# Patient Record
Sex: Female | Born: 1990 | Race: Black or African American | Hispanic: No | Marital: Single | Smoking: Current every day smoker
Health system: Southern US, Community
[De-identification: ages and names within clinical notes are randomized; demographics above are authoritative.]

## PROBLEM LIST (undated history)

## (undated) DIAGNOSIS — I10 Essential (primary) hypertension: Secondary | ICD-10-CM

---

## 2015-04-08 ENCOUNTER — Encounter (HOSPITAL_BASED_OUTPATIENT_CLINIC_OR_DEPARTMENT_OTHER): Payer: Self-pay | Admitting: *Deleted

## 2015-04-08 ENCOUNTER — Emergency Department (HOSPITAL_BASED_OUTPATIENT_CLINIC_OR_DEPARTMENT_OTHER)
Admission: EM | Admit: 2015-04-08 | Discharge: 2015-04-08 | Disposition: A | Discharge status: Home / Self Care | Payer: Self-pay | Attending: Emergency Medicine | Admitting: Emergency Medicine

## 2015-04-08 DIAGNOSIS — K0889 Other specified disorders of teeth and supporting structures: Secondary | ICD-10-CM

## 2015-04-08 DIAGNOSIS — Z3A13 13 weeks gestation of pregnancy: Secondary | ICD-10-CM | POA: Insufficient documentation

## 2015-04-08 DIAGNOSIS — F17210 Nicotine dependence, cigarettes, uncomplicated: Secondary | ICD-10-CM | POA: Insufficient documentation

## 2015-04-08 DIAGNOSIS — Z3490 Encounter for supervision of normal pregnancy, unspecified, unspecified trimester: Secondary | ICD-10-CM

## 2015-04-08 DIAGNOSIS — K029 Dental caries, unspecified: Secondary | ICD-10-CM | POA: Insufficient documentation

## 2015-04-08 DIAGNOSIS — O99611 Diseases of the digestive system complicating pregnancy, first trimester: Secondary | ICD-10-CM | POA: Insufficient documentation

## 2015-04-08 DIAGNOSIS — O99331 Smoking (tobacco) complicating pregnancy, first trimester: Secondary | ICD-10-CM | POA: Insufficient documentation

## 2015-04-08 DIAGNOSIS — K088 Other specified disorders of teeth and supporting structures: Secondary | ICD-10-CM | POA: Insufficient documentation

## 2015-04-08 LAB — PREGNANCY, URINE: Preg Test, Ur: POSITIVE — AB

## 2015-04-08 MED ORDER — HYDROCODONE-ACETAMINOPHEN 5-325 MG PO TABS: 1.0000 | ORAL_TABLET | Freq: Four times a day (QID) | ORAL | Status: DC | PRN

## 2015-04-08 MED ORDER — AMOXICILLIN 500 MG PO CAPS: 500.0000 mg | ORAL_CAPSULE | Freq: Three times a day (TID) | ORAL | Status: DC

## 2015-04-08 NOTE — Discharge Instructions (Signed)
Return here as needed.  Follow-up with the GYN clinic provided.  Follow-up with the dentist as well

## 2015-04-08 NOTE — ED Provider Notes (Signed)
CSN: [id number]     Arrival date & time 04/08/15  1644 History   First MD Initiated Contact with Patient 04/08/15 1714     Chief Complaint  Patient presents with  . Dental Pain     (Consider location/radiation/quality/duration/timing/severity/associated sxs/prior Treatment) HPI Patient presents to the emergency department with lower dental pain for the last 3 days.  Patient states that she has multiple bad teeth and she is unsure which ones causing the most pain.  Patient states that she has had no period since June.  Patient denies fever, nausea, vomiting, trouble swallowing, neck swelling, weakness, dizziness, headache, blurred vision, or syncope.  The patient states that she did not take any medications prior to arrival for her symptoms.  Patient also denies any abdominal pain History reviewed. No pertinent past medical history. Past Surgical History  Procedure Laterality Date  . Cesarean section     No family history on file. Social History  Substance Use Topics  . Smoking status: Current Every Day Smoker -- 0.50 packs/day    Types: Cigarettes  . Smokeless tobacco: None  . Alcohol Use: No   OB History    No data available     Review of Systems All other systems negative except as documented in the HPI. All pertinent positives and negatives as reviewed in the HPI.  Allergies  Review of patient's allergies indicates no known allergies.  Home Medications   Prior to Admission medications   Not on File   BP 129/67 mmHg  Pulse 82  Temp(Src) 98.7 F (37.1 C) (Oral)  Resp 20  Ht  (1.499 m)  Wt 172 lb (78.019 kg)  BMI 34.72 kg/m2  SpO2 100%  LMP 01/22/2015 Physical Exam  Constitutional: She is oriented to person, place, and time. She appears well-developed and well-nourished. No distress.  HENT:  Head: Normocephalic and atraumatic.  Mouth/Throat: Uvula is midline. No trismus in the jaw. No uvula swelling.    Eyes: Pupils are equal, round, and reactive to  light.  Cardiovascular: Normal rate, regular rhythm and normal heart sounds.   Abdominal: Soft. Bowel sounds are normal. There is no tenderness.  Neurological: She is alert and oriented to person, place, and time. She exhibits normal muscle tone. Coordination normal.  Skin: Skin is warm and dry.  Nursing note and vitals reviewed.   ED Course  Procedures (including critical care time) Labs Review Labs Reviewed  PREGNANCY, URINE - Abnormal; Notable for the following:    Preg Test, Ur POSITIVE (*)    All other components within normal limits    I have personally reviewed and evaluated these images and lab results as part of my medical decision-making.    Patient is advised that she is pregnant.  We will treat the patient for her dental issues a is advised to follow-up with the GYN doctor  Charlestine Night, PA-C 04/08/15 1749  Jerelyn Scott, MD 04/08/15 1754

## 2015-04-08 NOTE — ED Notes (Addendum)
Dental pain x 3 days. (she is not here for a pregnancy test, but she has not had a menses since June so triage nurse ordered a urine pregnancy test).

## 2016-11-30 ENCOUNTER — Encounter (HOSPITAL_BASED_OUTPATIENT_CLINIC_OR_DEPARTMENT_OTHER): Payer: Self-pay | Admitting: *Deleted

## 2016-11-30 ENCOUNTER — Emergency Department (HOSPITAL_BASED_OUTPATIENT_CLINIC_OR_DEPARTMENT_OTHER)
Admission: EM | Admit: 2016-11-30 | Discharge: 2016-11-30 | Disposition: A | Discharge status: Home / Self Care | Payer: Medicaid Other | Attending: Emergency Medicine | Admitting: Emergency Medicine

## 2016-11-30 DIAGNOSIS — K047 Periapical abscess without sinus: Secondary | ICD-10-CM | POA: Diagnosis not present

## 2016-11-30 DIAGNOSIS — F17210 Nicotine dependence, cigarettes, uncomplicated: Secondary | ICD-10-CM | POA: Diagnosis not present

## 2016-11-30 DIAGNOSIS — K0889 Other specified disorders of teeth and supporting structures: Secondary | ICD-10-CM | POA: Diagnosis present

## 2016-11-30 MED ORDER — LIDOCAINE VISCOUS 2 % MT SOLN: 15.0000 mL | OROMUCOSAL | 2 refills | Status: AC | PRN

## 2016-11-30 MED ORDER — HYDROCODONE-ACETAMINOPHEN 5-325 MG PO TABS: 1.0000 | ORAL_TABLET | Freq: Four times a day (QID) | ORAL | 0 refills | Status: AC | PRN

## 2016-11-30 MED ORDER — KETOROLAC TROMETHAMINE 60 MG/2ML IM SOLN
60.0000 mg | Freq: Once | INTRAMUSCULAR | Status: AC
  Administered 2016-11-30: 60 mg via INTRAMUSCULAR
  Filled 2016-11-30: qty 2

## 2016-11-30 MED ORDER — BUPIVACAINE-EPINEPHRINE (PF) 0.5% -1:200000 IJ SOLN
1.8000 mL | Freq: Once | INTRAMUSCULAR | Status: AC
  Administered 2016-11-30: 1.8 mL
  Filled 2016-11-30: qty 1.8

## 2016-11-30 MED ORDER — IBUPROFEN 600 MG PO TABS: 600.0000 mg | ORAL_TABLET | Freq: Four times a day (QID) | ORAL | 0 refills | Status: AC | PRN

## 2016-11-30 MED ORDER — PENICILLIN V POTASSIUM 500 MG PO TABS: 500.0000 mg | ORAL_TABLET | Freq: Four times a day (QID) | ORAL | 0 refills | Status: AC

## 2016-11-30 MED ORDER — PENICILLIN V POTASSIUM 250 MG PO TABS
500.0000 mg | ORAL_TABLET | Freq: Once | ORAL | Status: AC
  Administered 2016-11-30: 500 mg via ORAL
  Filled 2016-11-30: qty 2

## 2016-11-30 NOTE — ED Triage Notes (Signed)
Woke with swelling to the right side of her face.

## 2016-11-30 NOTE — Discharge Instructions (Signed)
You have been seen today for dental pain. You should follow up with a dentist as soon as possible. This problem will not resolve on its own without the care of a dentist. Use ibuprofen or naproxen for pain. Use the viscous lidocaine for mouth pain. Swish with the lidocaine and spit it out. Do not swallow it. Return to the ED immediately should you begin to have a fever (temperature over 100.19F), continued vomiting, difficulty swallowing or talking, drooling, or any worsening symptoms.  Please take all of your antibiotics until finished!   You may develop abdominal discomfort or diarrhea from the antibiotic.  You may help offset this with probiotics which you can buy or get in yogurt. Do not eat or take the probiotics until 2 hours after your antibiotic.   Dental Resource Guide  [facility] [address], [address] [place], [place] [address] [phone]  [facility] [place] [address] [place], [place] [address] [phone]  [facility] [address] [place], [place] [address] [phone]  [facility] [address] [place], [place] [address] [phone]  [facility] [address] [place], [place] [phone]) [phone]  [facility] [employer] [email or web address]/[email or web address]/becomepatient  [employer] [address] [place], [place] [address] [phone]  Website for free, low-income, or sliding scale dental services in [place]: www.freedental.us  To find a dentist in [place] and surrounding areas: [employer].[email or web address]/[email or web address]-the-public/find-[email or web address]-dentist  [facility] [employer][email or web address]://[email or web address]/meetings-events/[email or web address]-missions-[email or web address]-mercy  [facility] Medicaid Dentist http://[email or web address]/https://dma.[email or web address]/find-[email or web address]-doctor/medicaid-dental-providers

## 2016-11-30 NOTE — ED Provider Notes (Signed)
[facility] Provider Note   CSN: [id number] Arrival date & time: 11/30/16  1545  By signing my name below, I, Doreatha Martin, attest that this documentation has been prepared under the direction and in the presence of Shawn Joy, PA-C. Electronically Signed: Doreatha Martin, ED Scribe. 11/30/16. 4:21 PM.    History   Chief Complaint Chief Complaint  Patient presents with  . Dental Pain    HPI Carrie Mcfarland is a 26 y.o. female who presents to the Emergency Department complaining of moderate, gradually worsening right lower dental pain with radiation to the right forehead and right ear for a few days. Per pt, her symptoms started with a right-sided HA a week ago before her dental pain and gradual onset right-sided facial swelling began, which significantly worsened this morning. She also notes post-nasal drip and one episode of vomiting today after drinking water. Per pt, her post nasal drip is clear. Pt states her pain is worsened by eating and drinking. She has h/o dental extractions, but no h/o similar dental infections. She is not currently followed by dentistry. She denies fever/chills, other nausea/vomiting, chest pain, neck pain/stiffness, left ear pain, visual changes, ear drainage, difficulty swallowing or tolerating secretions, difficulty breathing, or any other complaints.    The history is provided by the patient. No language interpreter was used.    History reviewed. No pertinent past medical history.  There are no active problems to display for this patient.   Past Surgical History:  Procedure Laterality Date  . CESAREAN SECTION      OB History    No data available       Home Medications    Prior to Admission medications   Medication Sig Start Date End Date Taking? Authorizing Provider  HYDROcodone-acetaminophen (NORCO/VICODIN) 5-325 MG tablet Take 1 tablet by mouth every 6 (six) hours as needed. 11/30/16   Shawn C Joy, PA-C  ibuprofen (ADVIL,MOTRIN) 600 MG  tablet Take 1 tablet (600 mg total) by mouth every 6 (six) hours as needed. 11/30/16   Shawn C Joy, PA-C  lidocaine (XYLOCAINE) 2 % solution Use as directed 15 mLs in the mouth or throat as needed for mouth pain. 11/30/16   Shawn C Joy, PA-C  penicillin v potassium (VEETID) 500 MG tablet Take 1 tablet (500 mg total) by mouth 4 (four) times daily. 11/30/16 12/07/16  Anselm Pancoast, PA-C    Family History No family history on file.  Social History Social History  Substance Use Topics  . Smoking status: Current Every Day Smoker    Packs/day: 0.50    Types: Cigarettes  . Smokeless tobacco: Never Used  . Alcohol use No     Allergies   Patient has no known allergies.   Review of Systems Review of Systems  Constitutional: Negative for chills and fever.  HENT: Positive for dental problem, ear pain (right), facial swelling and postnasal drip. Negative for ear discharge and trouble swallowing.   Eyes: Negative for visual disturbance.  Respiratory: Negative for shortness of breath.   Cardiovascular: Negative for chest pain.  Gastrointestinal: Positive for vomiting (one episode). Negative for nausea.  Neurological: Positive for headaches. Negative for dizziness and light-headedness.  All other systems reviewed and are negative.    Physical Exam Updated Vital Signs BP (!) 152/88 (BP Location: Left Arm)   Pulse 76   Temp 99.2 F (37.3 C) (Oral)   Resp 18   Ht 4\' 11"  (1.499 m)   Wt 172 lb (78 kg)  LMP 11/16/2016   SpO2 100%   BMI 34.74 kg/m   Physical Exam  Constitutional: She appears well-developed and well-nourished. No distress.  HENT:  Head: Normocephalic and atraumatic.  Swelling to the right posterior mandibular gingival surface near the rear-most molar. The swelling appears to extend into the right buccal surface. There is no noted defined area of fluctuance. No noted sublingual swelling.  Tenderness and swelling to the right side of the face extending posteriorly to the angle  of the jaw. No extension into the submandibular space. No noted submental swelling. No noted left-sided swelling or tenderness. Mouth opening to about two finger widths. She has no trismus. She is handling all secretions without difficulty.   Eyes: Conjunctivae are normal.  Neck: Normal range of motion. Neck supple.  Cardiovascular: Normal rate, regular rhythm, normal heart sounds and intact distal pulses.   Pulmonary/Chest: Effort normal and breath sounds normal. No stridor. No respiratory distress.  Abdominal: She exhibits no distension. There is no guarding.  Musculoskeletal: Normal range of motion. She exhibits no edema.  Lymphadenopathy:    She has cervical adenopathy (bilateral).  Neurological: She is alert.  Skin: Skin is warm and dry. She is not diaphoretic.  Psychiatric: She has a normal mood and affect. Her behavior is normal.  Nursing note and vitals reviewed.    ED Treatments / Results   DIAGNOSTIC STUDIES: Oxygen Saturation is 100% on RA, normal by my interpretation.    COORDINATION OF CARE: 4:10 PM Discussed treatment plan with pt at bedside which includes dental follow up, antibiotics, antiinflammatories and pt agreed to plan. Pt offered dental block in the ED and wishes to proceed with the procedure.   Procedures Dental Block Date/Time: 11/30/2016 4:22 PM Performed by: Anselm PancoastJOY, SHAWN C Authorized by: Anselm PancoastJOY, SHAWN C   Consent:    Consent obtained:  Verbal   Consent given by:  Patient   Risks discussed:  Allergic reaction, infection, pain and unsuccessful block   Alternatives discussed:  No treatment Indications:    Indications: dental pain   Location:    Block type:  Inferior alveolar   Laterality:  Right Procedure details (see MAR for exact dosages):    Needle gauge:  27 G   Anesthetic injected:  Bupivacaine 0.5% WITH epi   Injection procedure:  Anatomic landmarks identified, anatomic landmarks palpated, introduced needle, negative aspiration for blood and  incremental injection Post-procedure details:    Outcome:  Anesthesia achieved   Patient tolerance of procedure:  Tolerated well, no immediate complications    (including critical care time)  Medications Ordered in ED Medications  ketorolac (TORADOL) injection 60 mg (60 mg Intramuscular Given 11/30/16 1627)  bupivacaine-epinephrine (MARCAINE W/ EPI) 0.5% -1:200000 injection 1.8 mL (1.8 mLs Infiltration Given 11/30/16 1628)  penicillin v potassium (VEETID) tablet 500 mg (500 mg Oral Given 11/30/16 1627)     Initial Impression / Assessment and Plan / ED Course  I have reviewed the triage vital signs and the nursing notes.        Patient presents with dental pain and facial swelling. Patient is nontoxic appearing, afebrile, not tachycardic, not tachypneic, not hypotensive, maintains SPO2 of 100% on room air, and is in no apparent distress. Patient has no signs of sepsis or other serious or life-threatening condition. I do not suspect Ludwig's angioedema in this patient. The patient was given instructions for home care, including a liquid diet, as well as strict return precautions. Patient voices understanding of these instructions, accepts the plan, and  is comfortable with discharge.    Vitals:   11/30/16 1547 11/30/16 1548 11/30/16 1702  BP: (!) 152/88  134/73  Pulse: 76  65  Resp: 18  18  Temp: 99.2 F (37.3 C)    TempSrc: Oral    SpO2: 100%  100%  Weight:  78 kg   Height:  4\' 11"  (1.499 m)       Final Clinical Impressions(s) / ED Diagnoses   Final diagnoses:  Dental infection    New Prescriptions Discharge Medication List as of 11/30/2016  4:58 PM    START taking these medications   Details  ibuprofen (ADVIL,MOTRIN) 600 MG tablet Take 1 tablet (600 mg total) by mouth every 6 (six) hours as needed., Starting Fri 11/30/2016, Print    lidocaine (XYLOCAINE) 2 % solution Use as directed 15 mLs in the mouth or throat as needed for mouth pain., Starting Fri 11/30/2016, Print      penicillin v potassium (VEETID) 500 MG tablet Take 1 tablet (500 mg total) by mouth 4 (four) times daily., Starting Fri 11/30/2016, Until Fri 12/07/2016, Print        I personally performed the services described in this documentation, which was scribed in my presence. The recorded information has been reviewed and is accurate.   Anselm Pancoast, PA-C 12/01/16 0121    Benjiman Core, MD 12/01/16 2308

## 2016-11-30 NOTE — ED Notes (Signed)
PA in for dental block, tol well

## 2016-11-30 NOTE — ED Notes (Signed)
Pt. Reports pain started as a headache a week ago and now the R side of face is swollen. Pt. Has had lower teeth extractions in past and now has molar noted with decay and poor dental hygiene.  Pt. Has swelling to the gingiva in the lower mandible area where the wisdom tooth would be.

## 2016-11-30 NOTE — ED Notes (Signed)
ED Provider at bedside. 

## 2017-06-07 ENCOUNTER — Other Ambulatory Visit: Payer: Self-pay

## 2017-06-07 ENCOUNTER — Encounter (HOSPITAL_BASED_OUTPATIENT_CLINIC_OR_DEPARTMENT_OTHER): Payer: Self-pay | Admitting: *Deleted

## 2017-06-07 ENCOUNTER — Emergency Department (HOSPITAL_BASED_OUTPATIENT_CLINIC_OR_DEPARTMENT_OTHER): Payer: Self-pay

## 2017-06-07 ENCOUNTER — Emergency Department (HOSPITAL_BASED_OUTPATIENT_CLINIC_OR_DEPARTMENT_OTHER)
Admission: EM | Admit: 2017-06-07 | Discharge: 2017-06-07 | Disposition: A | Discharge status: Home / Self Care | Payer: Self-pay | Attending: Emergency Medicine | Admitting: Emergency Medicine

## 2017-06-07 DIAGNOSIS — O99331 Smoking (tobacco) complicating pregnancy, first trimester: Secondary | ICD-10-CM | POA: Insufficient documentation

## 2017-06-07 DIAGNOSIS — Z79899 Other long term (current) drug therapy: Secondary | ICD-10-CM | POA: Insufficient documentation

## 2017-06-07 DIAGNOSIS — Z3A01 Less than 8 weeks gestation of pregnancy: Secondary | ICD-10-CM | POA: Insufficient documentation

## 2017-06-07 DIAGNOSIS — O039 Complete or unspecified spontaneous abortion without complication: Secondary | ICD-10-CM | POA: Insufficient documentation

## 2017-06-07 DIAGNOSIS — F17210 Nicotine dependence, cigarettes, uncomplicated: Secondary | ICD-10-CM | POA: Insufficient documentation

## 2017-06-07 LAB — CBC WITH DIFFERENTIAL/PLATELET
BASOS PCT: 0 %
Basophils Absolute: 0 10*3/uL (ref 0.0–0.1)
EOS ABS: 0 10*3/uL (ref 0.0–0.7)
Eosinophils Relative: 0 %
HEMATOCRIT: 36.2 % (ref 36.0–46.0)
HEMOGLOBIN: 12.6 g/dL (ref 12.0–15.0)
LYMPHS ABS: 1.4 10*3/uL (ref 0.7–4.0)
Lymphocytes Relative: 18 %
MCH: 32.4 pg (ref 26.0–34.0)
MCHC: 34.8 g/dL (ref 30.0–36.0)
MCV: 93.1 fL (ref 78.0–100.0)
Monocytes Absolute: 0.6 10*3/uL (ref 0.1–1.0)
Monocytes Relative: 7 %
NEUTROS ABS: 5.7 10*3/uL (ref 1.7–7.7)
NEUTROS PCT: 75 %
Platelets: 203 10*3/uL (ref 150–400)
RBC: 3.89 MIL/uL (ref 3.87–5.11)
RDW: 13.1 % (ref 11.5–15.5)
WBC: 7.6 10*3/uL (ref 4.0–10.5)

## 2017-06-07 LAB — COMPREHENSIVE METABOLIC PANEL
ALBUMIN: 4.4 g/dL (ref 3.5–5.0)
ALT: 11 U/L — ABNORMAL LOW (ref 14–54)
ANION GAP: 8 (ref 5–15)
AST: 19 U/L (ref 15–41)
Alkaline Phosphatase: 51 U/L (ref 38–126)
BILIRUBIN TOTAL: 0.5 mg/dL (ref 0.3–1.2)
BUN: 13 mg/dL (ref 6–20)
CO2: 26 mmol/L (ref 22–32)
Calcium: 9.2 mg/dL (ref 8.9–10.3)
Chloride: 104 mmol/L (ref 101–111)
Creatinine, Ser: 0.73 mg/dL (ref 0.44–1.00)
GFR calc Af Amer: >60 mL/min (ref >60)
Glucose, Bld: 99 mg/dL (ref 65–99)
POTASSIUM: 3.3 mmol/L — AB (ref 3.5–5.1)
Sodium: 138 mmol/L (ref 135–145)
TOTAL PROTEIN: 8 g/dL (ref 6.5–8.1)

## 2017-06-07 LAB — URINALYSIS, MICROSCOPIC (REFLEX)

## 2017-06-07 LAB — URINALYSIS, ROUTINE W REFLEX MICROSCOPIC
Glucose, UA: NEGATIVE mg/dL
Ketones, ur: 15 mg/dL — AB
NITRITE: NEGATIVE
Protein, ur: 30 mg/dL — AB
Specific Gravity, Urine: 1.025 (ref 1.005–1.030)
pH: 6.5 (ref 5.0–8.0)

## 2017-06-07 LAB — ABO/RH: ABO/RH(D): B POS

## 2017-06-07 LAB — HCG, QUANTITATIVE, PREGNANCY: HCG, BETA CHAIN, QUANT, S: 86 m[IU]/mL — AB (ref <5)

## 2017-06-07 LAB — LIPASE, BLOOD: LIPASE: 19 U/L (ref 11–51)

## 2017-06-07 MED ORDER — POTASSIUM CHLORIDE CRYS ER 20 MEQ PO TBCR: 20.0000 meq | EXTENDED_RELEASE_TABLET | Freq: Every day | ORAL | 0 refills | Status: AC

## 2017-06-07 MED ORDER — SODIUM CHLORIDE 0.9 % IV BOLUS (SEPSIS)
1000.0000 mL | Freq: Once | INTRAVENOUS | Status: AC
  Administered 2017-06-07: 1000 mL via INTRAVENOUS

## 2017-06-07 MED ORDER — ONDANSETRON 4 MG PO TBDP
4.0000 mg | ORAL_TABLET | Freq: Three times a day (TID) | ORAL | 0 refills | Status: AC | PRN
Start: 2017-06-07 — End: ?

## 2017-06-07 MED ORDER — ACETAMINOPHEN 325 MG PO TABS
650.0000 mg | ORAL_TABLET | Freq: Once | ORAL | Status: AC
  Administered 2017-06-07: 650 mg via ORAL
  Filled 2017-06-07: qty 2

## 2017-06-07 MED ORDER — ONDANSETRON HCL 4 MG/2ML IJ SOLN
4.0000 mg | Freq: Once | INTRAMUSCULAR | Status: AC
Start: 2017-06-07 — End: 2017-06-07
  Administered 2017-06-07: 4 mg via INTRAVENOUS
  Filled 2017-06-07: qty 2

## 2017-06-07 MED ORDER — POTASSIUM CHLORIDE CRYS ER 20 MEQ PO TBCR
40.0000 meq | EXTENDED_RELEASE_TABLET | Freq: Once | ORAL | Status: AC
  Administered 2017-06-07: 40 meq via ORAL
  Filled 2017-06-07: qty 2

## 2017-06-07 MED ORDER — ONDANSETRON HCL 4 MG/2ML IJ SOLN
4.0000 mg | Freq: Once | INTRAMUSCULAR | Status: AC
  Administered 2017-06-07: 4 mg via INTRAVENOUS
  Filled 2017-06-07: qty 2

## 2017-06-07 NOTE — ED Notes (Signed)
Patient transported to Ultrasound 

## 2017-06-07 NOTE — ED Provider Notes (Signed)
[facility] EMERGENCY DEPARTMENT Provider Note   CSN: [id number] Arrival date & time: 06/07/17  0850     History   Chief Complaint Chief Complaint  Patient presents with  . Vaginal Bleeding    possible miscarriage    HPI Carrie Mcfarland is a 26 y.o. female G4 P3 003 approximately 7 weeks 6 days pregnant based on LMP with no significant past medical history presenting with persistent abdominal pain and vaginal bleeding for the past week. Patient is reporting heavy bleeding and passing clots. She has been vomiting for the last 2 days and not eating in the past 4 days. She states that she was seen at [facility] on Monday and ultrasound performed did not visualize intrauterine pregnancy. She was told to follow-up with her OB/GYN but she reports that she was unable to do so. She returned to the emergency department but left without being seen twice in the last week. She is here today with continued bleeding and abdominal pain generalized but worse in the lower pelvic region all the way across her abdomen. Denies fever, chills, diarrhea or other symptoms.  HPI  History reviewed. No pertinent past medical history.  There are no active problems to display for this patient.   Past Surgical History:  Procedure Laterality Date  . CESAREAN SECTION      OB History    Gravida Para Term Preterm AB Living   1             SAB TAB Ectopic Multiple Live Births                   Home Medications    Prior to Admission medications   Medication Sig Start Date End Date Taking? Authorizing Provider  HYDROcodone-acetaminophen (NORCO/VICODIN) 5-325 MG tablet Take 1 tablet by mouth every 6 (six) hours as needed. 11/30/16   Joy, Shawn C, PA-C  ibuprofen (ADVIL,MOTRIN) 600 MG tablet Take 1 tablet (600 mg total) by mouth every 6 (six) hours as needed. 11/30/16   Joy, Shawn C, PA-C  lidocaine (XYLOCAINE) 2 % solution Use as directed 15 mLs in the mouth or throat as needed for mouth pain.  11/30/16   Joy, Shawn C, PA-C  ondansetron (ZOFRAN ODT) 4 MG disintegrating tablet Take 1 tablet (4 mg total) every 8 (eight) hours as needed by mouth for nausea or vomiting. 06/07/17   Mathews RobinsonsMitchell, Zebastian Carico B, PA-C  potassium chloride SA (K-DUR,KLOR-CON) 20 MEQ tablet Take 1 tablet (20 mEq total) daily for 4 doses by mouth. 06/07/17 06/11/17  Georgiana ShoreMitchell, Iviona Hole B, PA-C    Family History No family history on file.  Social History Social History   Tobacco Use  . Smoking status: Current Every Day Smoker    Packs/day: 0.50    Types: Cigarettes  . Smokeless tobacco: Never Used  Substance Use Topics  . Alcohol use: No  . Drug use: No     Allergies   Coconut oil   Review of Systems Review of Systems  Constitutional: Negative for chills and fever.  Eyes: Negative for visual disturbance.  Respiratory: Negative for cough, choking, chest tightness, shortness of breath, wheezing and stridor.   Cardiovascular: Negative for chest pain and leg swelling.  Gastrointestinal: Positive for abdominal pain, nausea and vomiting. Negative for abdominal distention, blood in stool and diarrhea.  Genitourinary: Positive for pelvic pain and vaginal bleeding. Negative for difficulty urinating, dysuria, flank pain and hematuria.  Musculoskeletal: Negative for gait problem, neck pain and neck stiffness.  Skin: Negative for color change, pallor, rash and wound.  Neurological: Negative for dizziness, syncope, weakness, light-headedness, numbness and headaches.  Psychiatric/Behavioral: Negative for behavioral problems.     Physical Exam Updated Vital Signs BP 134/80   Pulse 60   Temp 99.2 F (37.3 C)   Resp 15   Ht 4\' 11"  (1.499 m)   Wt 72.6 kg (160 lb)   LMP 04/13/2017 (Approximate)   SpO2 100%   BMI 32.32 kg/m   Physical Exam  Constitutional: She appears well-developed and well-nourished. No distress.  Afebrile, nontoxic-appearing, lying comfortably in bed in no acute distress.  HENT:  Head:  Normocephalic and atraumatic.  Eyes: Conjunctivae and EOM are normal.  Neck: Normal range of motion. Neck supple.  Cardiovascular: Normal rate, regular rhythm, normal heart sounds and intact distal pulses.  No murmur heard. Pulmonary/Chest: Effort normal and breath sounds normal. No stridor. No respiratory distress. She has no wheezes. She has no rales.  Abdominal: Soft. She exhibits no distension and no mass. There is tenderness. There is no rebound and no guarding.  Abdomen is soft and generally uncomfortable with palpation but no focal point tenderness. More tender in the suprapubic region.  Musculoskeletal: Normal range of motion. She exhibits no edema.  Neurological: She is alert.  Skin: Skin is warm and dry. Capillary refill takes less than 2 seconds. No rash noted. She is not diaphoretic. No erythema. No pallor.  Psychiatric: She has a normal mood and affect.  Nursing note and vitals reviewed.    ED Treatments / Results  Labs (all labs ordered are listed, but only abnormal results are displayed) Labs Reviewed  COMPREHENSIVE METABOLIC PANEL - Abnormal; Notable for the following components:      Result Value   Potassium 3.3 (*)    ALT 11 (*)    All other components within normal limits  HCG, QUANTITATIVE, PREGNANCY - Abnormal; Notable for the following components:   hCG, Beta Chain, Quant, S 86 (*)    All other components within normal limits  URINALYSIS, ROUTINE W REFLEX MICROSCOPIC - Abnormal; Notable for the following components:   APPearance CLOUDY (*)    Hgb urine dipstick LARGE (*)    Bilirubin Urine SMALL (*)    Ketones, ur 15 (*)    Protein, ur 30 (*)    Leukocytes, UA TRACE (*)    All other components within normal limits  URINALYSIS, MICROSCOPIC (REFLEX) - Abnormal; Notable for the following components:   Bacteria, UA MANY (*)    Squamous Epithelial / LPF 6-30 (*)    All other components within normal limits  CBC WITH DIFFERENTIAL/PLATELET  LIPASE, BLOOD    ABO/RH    EKG  EKG Interpretation None       Radiology US Ob Comp < 14 Wks  Result Date: 06/07/2017 CLINICAL DATA:  Abdominal pain and bleeding. EXAM: OBSTETRIC <14 WK [place] AND TRANSVAGINAL OB US TECHNIQUE: Both transabdominal and transvaginal ultrasound examinations were performed for complete evaluation of the gestation as well as the maternal uterus, adnexal regions, and pelvic cul-de-sac. Transvaginal technique was performed to assess early pregnancy. COMPARISON:  None. FINDINGS: Intrauterine gestational sac: None Yolk sac:  None Embryo:  None Subchorionic hemorrhage:  Does not apply Maternal uterus/adnexae: Right ovary: Normal Left ovary: Normal Other :The endometrium measures 12 mm in thickness. No focal abnormality identified. Free fluid:  Trace free fluid within the pelvis. IMPRESSION: 1. No intrauterine gestational sac, yolk sac, or fetal pole identified. Differential considerations include intrauterine pregnancy too  early to be sonographically visualized, missed abortion, or ectopic pregnancy. Followup ultrasound is recommended in 10-14 days for further evaluation. Electronically Signed   By: Signa Kellaylor  Stroud M.D.   On: 06/07/2017 10:24   [place] Ob Transvaginal  Result Date: 06/07/2017 CLINICAL DATA:  Abdominal pain and bleeding. EXAM: OBSTETRIC <14 WK US AND TRANSVAGINAL OB US TECHNIQUE: Both transabdominal and transvaginal ultrasound examinations were performed for complete evaluation of the gestation as well as the maternal uterus, adnexal regions, and pelvic cul-de-sac. Transvaginal technique was performed to assess early pregnancy. COMPARISON:  None. FINDINGS: Intrauterine gestational sac: None Yolk sac:  None Embryo:  None Subchorionic hemorrhage:  Does not apply Maternal uterus/adnexae: Right ovary: Normal Left ovary: Normal Other :The endometrium measures 12 mm in thickness. No focal abnormality identified. Free fluid:  Trace free fluid within the pelvis. IMPRESSION: 1. No intrauterine  gestational sac, yolk sac, or fetal pole identified. Differential considerations include intrauterine pregnancy too early to be sonographically visualized, missed abortion, or ectopic pregnancy. Followup ultrasound is recommended in 10-14 days for further evaluation. Electronically Signed   By: Signa Kellaylor  Stroud M.D.   On: 06/07/2017 10:24    Procedures Procedures (including critical care time)  Medications Ordered in ED Medications  sodium chloride 0.9 % bolus 1,000 mL (0 mLs Intravenous Stopped 06/07/17 1054)  ondansetron (ZOFRAN) injection 4 mg (4 mg Intravenous Given 06/07/17 0931)  acetaminophen (TYLENOL) tablet 650 mg (650 mg Oral Given 06/07/17 0930)  potassium chloride SA (K-DUR,KLOR-CON) CR tablet 40 mEq (40 mEq Oral Given 06/07/17 1115)  ondansetron (ZOFRAN) injection 4 mg (4 mg Intravenous Given 06/07/17 1145)     Initial Impression / Assessment and Plan / ED Course  I have reviewed the triage vital signs and the nursing notes.  Pertinent labs & imaging results that were available during my care of the patient were reviewed by me and considered in my medical decision making (see chart for details).    Otherwise healthy 26 year old female G4 P3 003 presenting with persistent vaginal bleeding and abdominal pain. Patient was seen at [facility] on Monday for same. US did not show IUP, she was asked to follow up for repeat labs. She left without being seen twice this week due to long wait times at [facility].  Will repeat ultrasound and quantitative Patient's nausea and pain treated in the emergency department  Will give fluids and reassess On reassessment, patient reported improvement and was resting comfortably. Normal vital signs and stable. Successful PO challenge.  Ultrasound negative for intrauterine pregnancy or ectopic. Quants have been trending down 05/26/17: 10 332 06/04/17  :     207  06/05/17  :     176 06/07/17  :       86  On pelvic exam, small amount  of dark blood in the vaginal vault, the os appears open, no POC visualized.   Patient's reported symptoms, exam findings and trending down quant Hcg consistent with spontaneous abortion.  Will dc home with close follow up with OB/GYN in clinic and patient was provided with resources.   Discussed strict return precautions and advised to return to the emergency department if experiencing any new or worsening symptoms. Instructions were understood and patient agreed with discharge plan. Final Clinical Impressions(s) / ED Diagnoses   Final diagnoses:  Spontaneous abortion in first trimester    ED Discharge Orders        Ordered    ondansetron (ZOFRAN ODT) 4 MG disintegrating tablet  Every 8  hours PRN     06/07/17 1058    potassium chloride SA (K-DUR,KLOR-CON) 20 MEQ tablet  Daily     06/07/17 1058       Gregary Cromer 06/07/17 Nida Boatman    Arby Barrette, MD 06/08/17 [phone]

## 2017-06-07 NOTE — ED Notes (Signed)
ED Provider at bedside. 

## 2017-06-07 NOTE — ED Triage Notes (Signed)
Pt reports being approx [redacted] weeks pregnant and has been bleeding since this past Monday. Reports being seen x2 since then and was diagnosed with a possible miscarriage. Reports passing clots and going through approx 4 pads/hr. Reports n/v and some dizziness.

## 2017-06-07 NOTE — ED Notes (Signed)
Pt able to tolerate sips of water with no n/v.   

## 2017-06-07 NOTE — ED Notes (Signed)
Given water for fluid challenge.   

## 2017-06-07 NOTE — Discharge Instructions (Addendum)
As discussed, Your pregnancy hormone has been trending down since onset of symptoms and no pregnancy was visualized in the uterus today. Given one week of heavy bleeding, ultrasound and lab findings, it is likely that you experienced a spontaneous miscarriage.   Please follow up with OB/GYN of your choice or the women's outpatient clinic on Monday morning and for repeat US as needed in 10 days. Stay well-hydrated drinking enough water to keep your urine clear. Take potassium supplements and zofran as needed for nausea/vomiting. Start reintroducing foods slowly, starting with broth and then bland foods.  Return if symptoms worsen in the meantime.

## 2017-10-22 ENCOUNTER — Emergency Department (HOSPITAL_BASED_OUTPATIENT_CLINIC_OR_DEPARTMENT_OTHER)
Admission: EM | Admit: 2017-10-22 | Discharge: 2017-10-23 | Disposition: A | Discharge status: Home / Self Care | Payer: Self-pay | Attending: Emergency Medicine | Admitting: Emergency Medicine

## 2017-10-22 ENCOUNTER — Emergency Department (HOSPITAL_BASED_OUTPATIENT_CLINIC_OR_DEPARTMENT_OTHER): Payer: Self-pay

## 2017-10-22 ENCOUNTER — Other Ambulatory Visit: Payer: Self-pay

## 2017-10-22 ENCOUNTER — Encounter (HOSPITAL_BASED_OUTPATIENT_CLINIC_OR_DEPARTMENT_OTHER): Payer: Self-pay | Admitting: Emergency Medicine

## 2017-10-22 DIAGNOSIS — R102 Pelvic and perineal pain: Secondary | ICD-10-CM | POA: Insufficient documentation

## 2017-10-22 DIAGNOSIS — B9689 Other specified bacterial agents as the cause of diseases classified elsewhere: Secondary | ICD-10-CM

## 2017-10-22 DIAGNOSIS — F17210 Nicotine dependence, cigarettes, uncomplicated: Secondary | ICD-10-CM | POA: Insufficient documentation

## 2017-10-22 DIAGNOSIS — Z79899 Other long term (current) drug therapy: Secondary | ICD-10-CM | POA: Insufficient documentation

## 2017-10-22 DIAGNOSIS — N760 Acute vaginitis: Secondary | ICD-10-CM

## 2017-10-22 DIAGNOSIS — R112 Nausea with vomiting, unspecified: Secondary | ICD-10-CM

## 2017-10-22 DIAGNOSIS — R197 Diarrhea, unspecified: Secondary | ICD-10-CM

## 2017-10-22 DIAGNOSIS — O26892 Other specified pregnancy related conditions, second trimester: Secondary | ICD-10-CM

## 2017-10-22 DIAGNOSIS — R1084 Generalized abdominal pain: Secondary | ICD-10-CM | POA: Insufficient documentation

## 2017-10-22 LAB — COMPREHENSIVE METABOLIC PANEL
ALK PHOS: 40 U/L (ref 38–126)
ALT: 11 U/L — AB (ref 14–54)
AST: 18 U/L (ref 15–41)
Albumin: 3.3 g/dL — ABNORMAL LOW (ref 3.5–5.0)
Anion gap: 8 (ref 5–15)
BUN: 7 mg/dL (ref 6–20)
CALCIUM: 7.7 mg/dL — AB (ref 8.9–10.3)
CO2: 20 mmol/L — AB (ref 22–32)
CREATININE: 0.47 mg/dL (ref 0.44–1.00)
Chloride: 108 mmol/L (ref 101–111)
GFR calc non Af Amer: >60 mL/min (ref >60)
Glucose, Bld: 99 mg/dL (ref 65–99)
Potassium: 2.9 mmol/L — ABNORMAL LOW (ref 3.5–5.1)
SODIUM: 136 mmol/L (ref 135–145)
Total Bilirubin: 0.4 mg/dL (ref 0.3–1.2)
Total Protein: 6.3 g/dL — ABNORMAL LOW (ref 6.5–8.1)

## 2017-10-22 LAB — CBC WITH DIFFERENTIAL/PLATELET
BASOS PCT: 0 %
Basophils Absolute: 0 10*3/uL (ref 0.0–0.1)
Eosinophils Absolute: 0 10*3/uL (ref 0.0–0.7)
Eosinophils Relative: 0 %
HEMATOCRIT: 30.1 % — AB (ref 36.0–46.0)
Hemoglobin: 10.7 g/dL — ABNORMAL LOW (ref 12.0–15.0)
LYMPHS ABS: 1.5 10*3/uL (ref 0.7–4.0)
LYMPHS PCT: 13 %
MCH: 32.8 pg (ref 26.0–34.0)
MCHC: 35.5 g/dL (ref 30.0–36.0)
MCV: 92.3 fL (ref 78.0–100.0)
MONOS PCT: 7 %
Monocytes Absolute: 0.9 10*3/uL (ref 0.1–1.0)
NEUTROS ABS: 9.4 10*3/uL — AB (ref 1.7–7.7)
Neutrophils Relative %: 80 %
Platelets: 195 10*3/uL (ref 150–400)
RBC: 3.26 MIL/uL — ABNORMAL LOW (ref 3.87–5.11)
RDW: 12.9 % (ref 11.5–15.5)
WBC: 11.8 10*3/uL — ABNORMAL HIGH (ref 4.0–10.5)

## 2017-10-22 LAB — URINALYSIS, ROUTINE W REFLEX MICROSCOPIC
GLUCOSE, UA: NEGATIVE mg/dL
HGB URINE DIPSTICK: NEGATIVE
Leukocytes, UA: NEGATIVE
Nitrite: NEGATIVE
PROTEIN: NEGATIVE mg/dL
Specific Gravity, Urine: 1.025 (ref 1.005–1.030)
pH: 6 (ref 5.0–8.0)

## 2017-10-22 LAB — WET PREP, GENITAL
SPERM: NONE SEEN
Trich, Wet Prep: NONE SEEN
YEAST WET PREP: NONE SEEN

## 2017-10-22 LAB — HCG, QUANTITATIVE, PREGNANCY: hCG, Beta Chain, Quant, S: 49611 m[IU]/mL — ABNORMAL HIGH (ref <5)

## 2017-10-22 LAB — LIPASE, BLOOD: Lipase: 26 U/L (ref 11–51)

## 2017-10-22 LAB — PREGNANCY, URINE: Preg Test, Ur: POSITIVE — AB

## 2017-10-22 MED ORDER — SODIUM CHLORIDE 0.9 % IV BOLUS
1000.0000 mL | Freq: Once | INTRAVENOUS | Status: AC
  Administered 2017-10-22: 1000 mL via INTRAVENOUS

## 2017-10-22 MED ORDER — MORPHINE SULFATE (PF) 2 MG/ML IV SOLN
2.0000 mg | Freq: Once | INTRAVENOUS | Status: AC
  Administered 2017-10-22: 2 mg via INTRAVENOUS
  Filled 2017-10-22: qty 1

## 2017-10-22 MED ORDER — ONDANSETRON HCL 4 MG/2ML IJ SOLN
4.0000 mg | Freq: Once | INTRAMUSCULAR | Status: AC
  Administered 2017-10-22: 4 mg via INTRAVENOUS
  Filled 2017-10-22: qty 2

## 2017-10-22 MED ORDER — POTASSIUM CHLORIDE CRYS ER 20 MEQ PO TBCR
40.0000 meq | EXTENDED_RELEASE_TABLET | Freq: Once | ORAL | Status: AC
  Administered 2017-10-22: 40 meq via ORAL
  Filled 2017-10-22: qty 2

## 2017-10-22 MED ORDER — ACETAMINOPHEN 325 MG PO TABS
650.0000 mg | ORAL_TABLET | Freq: Once | ORAL | Status: AC
  Administered 2017-10-22: 650 mg via ORAL
  Filled 2017-10-22 (×2): qty 2

## 2017-10-22 NOTE — ED Notes (Signed)
Generalized abdominal pain with nausea, states she has not had a period in several months, had a positive pregnancy test at home.  Denies bleeding, denies dysuria, denies discharge

## 2017-10-22 NOTE — ED Provider Notes (Signed)
[facility] EMERGENCY DEPARTMENT Provider Note   CSN: [id number] Arrival date & time: 10/22/17  1756     History   Chief Complaint Chief Complaint  Patient presents with  . Abdominal Pain  . Pelvic Pain    HPI Carrie Mcfarland is a 27 y.o. female who presents for evaluation of 2 days of lower abdominal pain associated with nausea/vomiting.  Patient reports that pain has been constant.  She has been taking Tylenol with no improvement in her symptoms.  Patient denies any other alleviating or aggravating factors.  She reports she has had several episodes of nonbloody, nonbilious emesis.  She reports she has not been able to tolerate any p.o. since onset of symptoms. Patient also reports that she has had sevearl episodes of non-bloody diarrhea.  Patient reports that at home, she had a positive urine pregnancy test.  Patient reports she is currently sexually active with one partner.  Patient denies any fevers, chest pain, difficulty breathing, dysuria, hematuria, vaginal bleeding, vaginal discharge.  Of note, patient is G4, P3 with 1 miscarriage.  The history is provided by the patient.    History reviewed. No pertinent past medical history.  There are no active problems to display for this patient.   Past Surgical History:  Procedure Laterality Date  . CESAREAN SECTION       OB History    Gravida  1   Para      Term      Preterm      AB      Living        SAB      TAB      Ectopic      Multiple      Live Births               Home Medications    Prior to Admission medications   Medication Sig Start Date End Date Taking? Authorizing Provider  HYDROcodone-acetaminophen (NORCO/VICODIN) 5-325 MG tablet Take 1 tablet by mouth every 6 (six) hours as needed. 11/30/16   Joy, Shawn C, PA-C  ibuprofen (ADVIL,MOTRIN) 600 MG tablet Take 1 tablet (600 mg total) by mouth every 6 (six) hours as needed. 11/30/16   Joy, Shawn C, PA-C  lidocaine (XYLOCAINE) 2 %  solution Use as directed 15 mLs in the mouth or throat as needed for mouth pain. 11/30/16   Joy, Shawn C, PA-C  metroNIDAZOLE (FLAGYL) 500 MG tablet Take 1 tablet (500 mg total) by mouth 2 (two) times daily. 10/23/17   Maxwell Caul, PA-C  ondansetron (ZOFRAN ODT) 4 MG disintegrating tablet Take 1 tablet (4 mg total) every 8 (eight) hours as needed by mouth for nausea or vomiting. 06/07/17   Mathews Robinsons B, PA-C  ondansetron (ZOFRAN) 4 MG tablet Take 1 tablet (4 mg total) by mouth every 6 (six) hours. 10/23/17   Maxwell Caul, PA-C  potassium chloride SA (K-DUR,KLOR-CON) 20 MEQ tablet Take 1 tablet (20 mEq total) daily for 4 doses by mouth. 06/07/17 06/11/17  Georgiana Shore, PA-C    Family History History reviewed. No pertinent family history.  Social History Social History   Tobacco Use  . Smoking status: Current Every Day Smoker    Packs/day: 0.50    Types: Cigarettes  . Smokeless tobacco: Never Used  Substance Use Topics  . Alcohol use: No  . Drug use: No     Allergies   Coconut oil   Review of Systems Review of Systems  Constitutional: Negative for chills and fever.  HENT: Negative for congestion.   Eyes: Negative for visual disturbance.  Respiratory: Negative for cough and shortness of breath.   Cardiovascular: Negative for chest pain.  Gastrointestinal: Positive for abdominal pain, nausea and vomiting. Negative for diarrhea.  Genitourinary: Negative for dysuria and hematuria.  Musculoskeletal: Negative for back pain and neck pain.  Skin: Negative for rash.  Neurological: Negative for dizziness, weakness, numbness and headaches.  Psychiatric/Behavioral: Negative for confusion.     Physical Exam Updated Vital Signs BP 133/82 (BP Location: Left Arm)   Pulse 60   Temp 98.5 F (36.9 C) (Oral)   Resp 18   Ht 4\' 11"  (1.499 m)   Wt 72.6 kg (160 lb)   SpO2 100%   Breastfeeding? Unknown   BMI 32.32 kg/m   Physical Exam  Constitutional: She is  oriented to person, place, and time. She appears well-developed and well-nourished.  HENT:  Head: Normocephalic and atraumatic.  Mouth/Throat: Oropharynx is clear and moist. Mucous membranes are dry.  Eyes: Pupils are equal, round, and reactive to light. Conjunctivae, EOM and lids are normal.  Neck: Full passive range of motion without pain.  Cardiovascular: Normal rate, regular rhythm, normal heart sounds and normal pulses. Exam reveals no gallop and no friction rub.  No murmur heard. Pulmonary/Chest: Effort normal and breath sounds normal.  Abdominal: Soft. There is generalized tenderness. There is no rigidity, no guarding, no CVA tenderness and no tenderness at McBurney's point.  Gravid abdomen. Abdomen is soft.  Diffuse tenderness with no focal point.  No tenderness to McBurney's point.  No CVA tenderness bilaterally.  No rigidity, guarding.  Genitourinary: Uterus normal. Cervix exhibits no motion tenderness and no friability. Right adnexum displays no mass and no tenderness. Left adnexum displays no mass and no tenderness. No bleeding in the vagina. Vaginal discharge found.  Genitourinary Comments: The exam was performed with a chaperone present. Normal external female genitalia. No lesions, rash, or sores.  White discharge noted in the vaginal vault.  Cervix is without erythema, CMT, friability.  No vaginal bleeding.  No adnexal mass or tenderness bilaterally.  Cervical os is closed.  Musculoskeletal: Normal range of motion.  Neurological: She is alert and oriented to person, place, and time.  Skin: Skin is warm and dry. Capillary refill takes less than 2 seconds.  Psychiatric: She has a normal mood and affect. Her speech is normal.  Nursing note and vitals reviewed.    ED Treatments / Results  Labs (all labs ordered are listed, but only abnormal results are displayed) Labs Reviewed  WET PREP, GENITAL - Abnormal; Notable for the following components:      Result Value   Clue Cells  Wet Prep HPF POC PRESENT (*)    WBC, Wet Prep HPF POC FEW (*)    All other components within normal limits  URINALYSIS, ROUTINE W REFLEX MICROSCOPIC - Abnormal; Notable for the following components:   APPearance HAZY (*)    Bilirubin Urine SMALL (*)    Ketones, ur >80 (*)    All other components within normal limits  PREGNANCY, URINE - Abnormal; Notable for the following components:   Preg Test, Ur POSITIVE (*)    All other components within normal limits  CBC WITH DIFFERENTIAL/PLATELET - Abnormal; Notable for the following components:   WBC 11.8 (*)    RBC 3.26 (*)    Hemoglobin 10.7 (*)    HCT 30.1 (*)    Neutro Abs 9.4 (*)  All other components within normal limits  HCG, QUANTITATIVE, PREGNANCY - Abnormal; Notable for the following components:   hCG, Beta Chain, Quant, S 49,611 (*)    All other components within normal limits  COMPREHENSIVE METABOLIC PANEL - Abnormal; Notable for the following components:   Potassium 2.9 (*)    CO2 20 (*)    Calcium 7.7 (*)    Total Protein 6.3 (*)    Albumin 3.3 (*)    ALT 11 (*)    All other components within normal limits  LIPASE, BLOOD  GC/CHLAMYDIA PROBE AMP ([place]) [facility]    EKG None  Radiology [place] Ob Limited  Result Date: 10/23/2017 CLINICAL DATA:  Pelvic pain EXAM: LIMITED OBSTETRIC ULTRASOUND FINDINGS: Number of Fetuses: 1 Heart Rate:  157 bpm Movement: Present Presentation: Breech Placental Location: Posterior Previa: Placental edge approaches the internal os. Amniotic Fluid (Subjective):  Within normal limits. BPD: 3.48 cm 16 w  5 d MATERNAL FINDINGS: Cervix:  Appears closed. Uterus/Adnexae: Ovaries are non identified.  No free fluid IMPRESSION: Single viable intrauterine pregnancy as above. Marginal placenta. Attention on follow-up morphology scan is recommended. This exam is performed on an emergent basis and does not comprehensively evaluate fetal size, dating, or anatomy; follow-up complete OB US should be  considered if further fetal assessment is warranted. Electronically Signed   By: Jasmine PangKim  Fujinaga M.D.   On: 10/23/2017 00:04    Procedures Procedures (including critical care time)  Medications Ordered in ED Medications  sodium chloride 0.9 % bolus 1,000 mL (0 mLs Intravenous Stopped 10/22/17 2021)  ondansetron (ZOFRAN) injection 4 mg (4 mg Intravenous Given 10/22/17 1918)  acetaminophen (TYLENOL) tablet 650 mg (650 mg Oral Given 10/22/17 2044)  ondansetron (ZOFRAN) injection 4 mg (4 mg Intravenous Given 10/22/17 2028)  sodium chloride 0.9 % bolus 1,000 mL (0 mLs Intravenous Stopped 10/22/17 2136)  potassium chloride SA (K-DUR,KLOR-CON) CR tablet 40 mEq (40 mEq Oral Given 10/22/17 2044)  morphine 2 MG/ML injection 2 mg (2 mg Intravenous Given 10/22/17 2233)     Initial Impression / Assessment and Plan / ED Course  I have reviewed the triage vital signs and the nursing notes.  Pertinent labs & imaging results that were available during my care of the patient were reviewed by me and considered in my medical decision making (see chart for details).     27 year old female who presents for evaluation of 2 days of abdominal pain, nausea/vomiting, positive urine pregnancy test.  No fevers, chest pain, difficulty breathing, urinary complaints, vaginal bleeding, vaginal discharge. Patient is afebrile, non-toxic appearing, sitting comfortably on examination table. Vital signs reviewed and stable.  On exam, patient abdomen is soft, nondistended, no rigidity, guarding.  There is diffuse tenderness noted with no focal point.  No CVA tenderness.  Consider acute infectious etiology versus associated with pregnancy.  Given that patient has been afebrile, does not have any focal point tenderness, but having diarrhea, do not suspect appendicitis at this time.  Patient with no tenderness to palpation at McBurney's point.  Plan to check basic labs, UA.  Patient given IVF and antiemetics.  Urine pregnancy positive.   Beta quant is [id number].  Lipase is unremarkable.  CMP shows hypokalemia of 2.9.  Bicarb is 20.  Patient has an anion gap of 8.  UA shows ketones.  No leukocytes, nitrates that would be concerning for bacteria.  CBC with slight leukocytosis and anemia.  Discussed results with patient.  Patient reports improvement in nausea.  She  has not had any vomiting since being here in the ED.  Still having some significant pain.  Pelvic exam as documented above.  There was a small amount of white discharge.  Pelvic exam is not concerning for PID.  We will plan to get an ultrasound for further evaluation.  Ultrasound reviewed.  Shows intrauterine gestation at approximately 16 weeks.  No other acute abnormalities.  Wet prep is positive for clue cells.  I discussed with pharmacy regarding treatment for patient's BV.  Given that patient is currently in her second trimester, patient can be safely discharged home with Flagyl.  I discussed results with patient.  Patient reports improvement in pain after analgesics here in the department.  She has been able to tolerate p.o. without any difficulty.  Vital signs stable and reviewed.  Instructed patient that she will have to follow-up with OB/GYN for further evaluation and care of this pregnancy.  Will plan a referral to women's health.  Repeat abdominal exam shows improved tenderness.  Patient with no focal point tenderness, no tenderness at McBurney's point to be concerning for appendicitis.  Suspect that this is viral in etiology.  Patient stable for discharge at this time. Patient had ample opportunity for questions and discussion. All patient's questions were answered with full understanding. Strict return precautions discussed. Patient expresses understanding and agreement to plan.   Final Clinical Impressions(s) / ED Diagnoses   Final diagnoses:  Generalized abdominal pain  Nausea vomiting and diarrhea  Bacterial vaginosis    ED Discharge Orders        Ordered     metroNIDAZOLE (FLAGYL) 500 MG tablet  2 times daily     10/23/17 0055    ondansetron (ZOFRAN) 4 MG tablet  Every 6 hours     10/23/17 0055       Maxwell Caul, PA-C 10/24/17 0226    Rolland Porter, MD 10/28/17 [phone]

## 2017-10-22 NOTE — ED Notes (Addendum)
Walked into room to give patient some tylenol and her visitor stated, "I mean, y'all haven't even done anything, I coulda done that at home, I gave her tylenol, she's still throwing up, the nausea medication did not work, what are you all doing?" Informed visitor that nurse was not aware that the patient was still vomiting, and that the provider would be notified.  Informed pt that she can only have tylenol for pain as she is pregnant. No evidence of emesis is present in room despite the claims of patient and visitor.

## 2017-10-22 NOTE — ED Notes (Signed)
Pt returned from ultrasound

## 2017-10-22 NOTE — ED Triage Notes (Signed)
Patient states that she is having generalized abdominal pain and N/V/D  - the patient states that she can not keep anything down

## 2017-10-22 NOTE — ED Notes (Signed)
Urine sample and culture sent to the lab  

## 2017-10-22 NOTE — ED Notes (Signed)
Patient transported to Ultrasound 

## 2017-10-23 LAB — GC/CHLAMYDIA PROBE AMP (~~LOC~~) NOT AT ARMC
CHLAMYDIA, DNA PROBE: NEGATIVE
NEISSERIA GONORRHEA: NEGATIVE

## 2017-10-23 MED ORDER — ONDANSETRON HCL 4 MG PO TABS: 4.0000 mg | ORAL_TABLET | Freq: Four times a day (QID) | ORAL | 0 refills | Status: AC

## 2017-10-23 MED ORDER — METRONIDAZOLE 500 MG PO TABS: 500.0000 mg | ORAL_TABLET | Freq: Two times a day (BID) | ORAL | 0 refills | Status: AC

## 2017-10-23 NOTE — ED Notes (Signed)
Pt verbalizes understanding of d/c instructions and denies any further needs at this time. 

## 2017-10-23 NOTE — Discharge Instructions (Signed)
Take zofran as directed for nausea.   Take tylenol for pain.  Take antibiotics as directed. Please take all of your antibiotics until finished.  [facility] Call their office and arrange for an appointment for further evaluation.   Return to the Emergency Department for any worsening pain, fever, vomiting, chest pain, difficulty breathing, vaginal bleeding, or any other worsening or concerning symptoms.

## 2017-12-17 IMAGING — US US OB COMP LESS 14 WK
1 series · 14 of 28 positions shown · non-contrast
Comparison: None.

CLINICAL DATA: Abdominal pain and bleeding.

EXAM:
OBSTETRIC <14 WK US AND TRANSVAGINAL OB US
TECHNIQUE: Both transabdominal and transvaginal ultrasound examinations were
performed for complete evaluation of the gestation as well as the
maternal uterus, adnexal regions, and pelvic cul-de-sac.
Transvaginal technique was performed to assess early pregnancy.

[Series 1: us ob comp less 14 wk · 0.18mm/px · 14 of 106 slices shown]
[im 4/106]
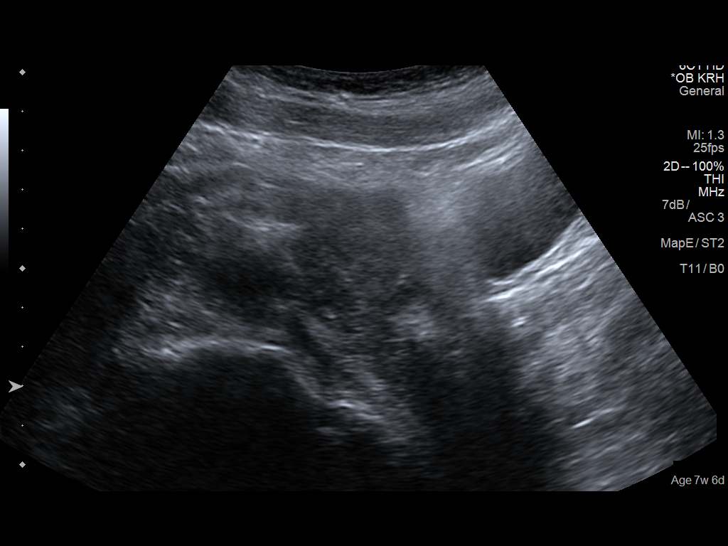
[im 12/106]
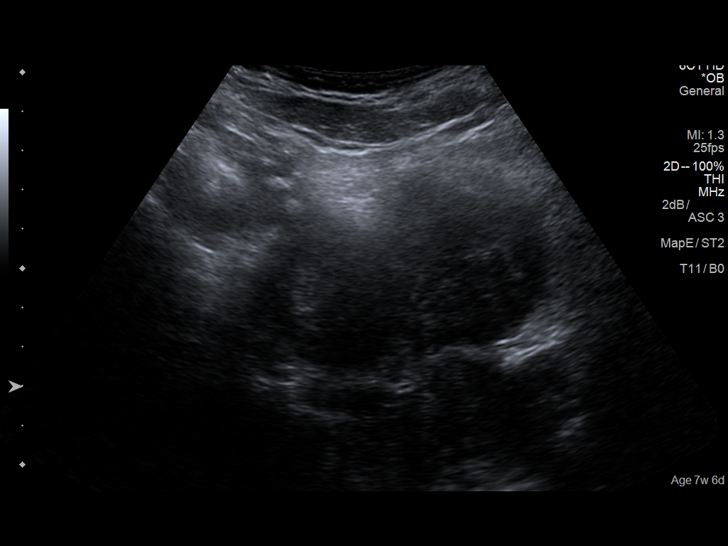
[im 20/106]
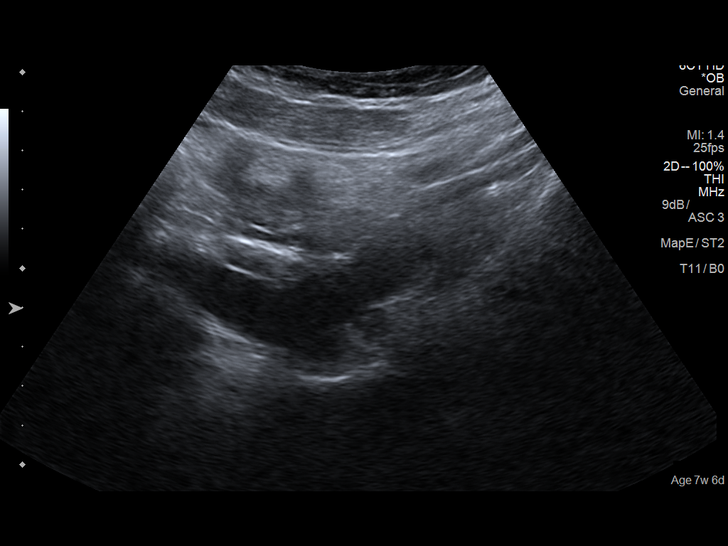
[im 28/106]
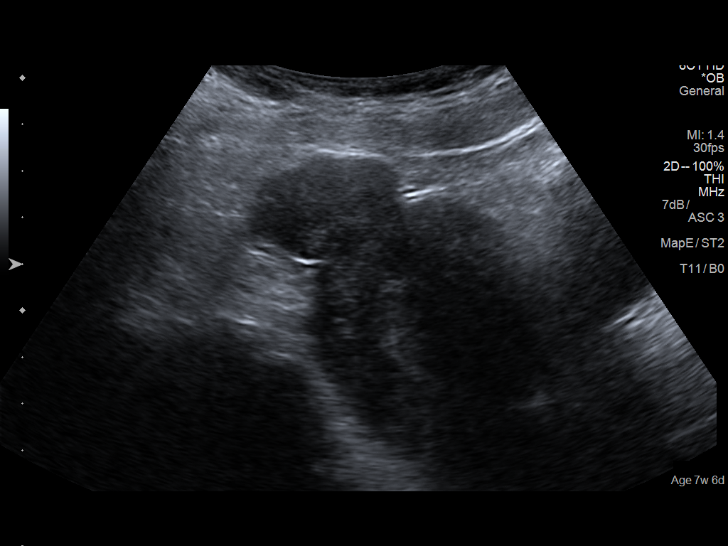
[im 36/106]
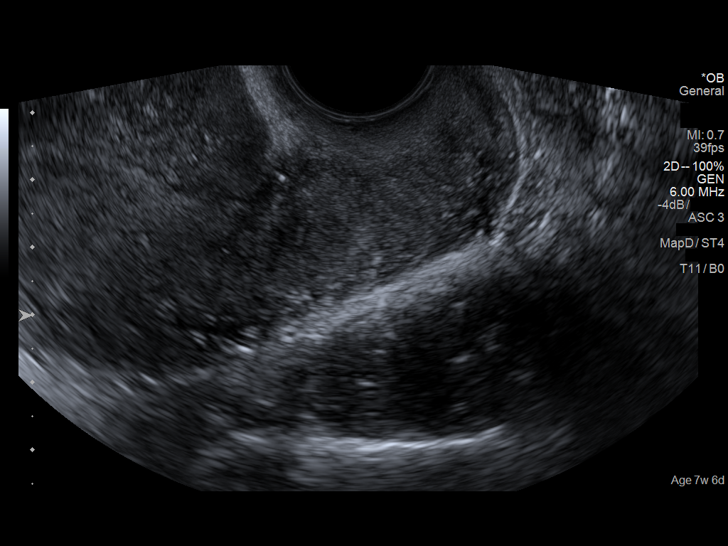
[im 43/106]
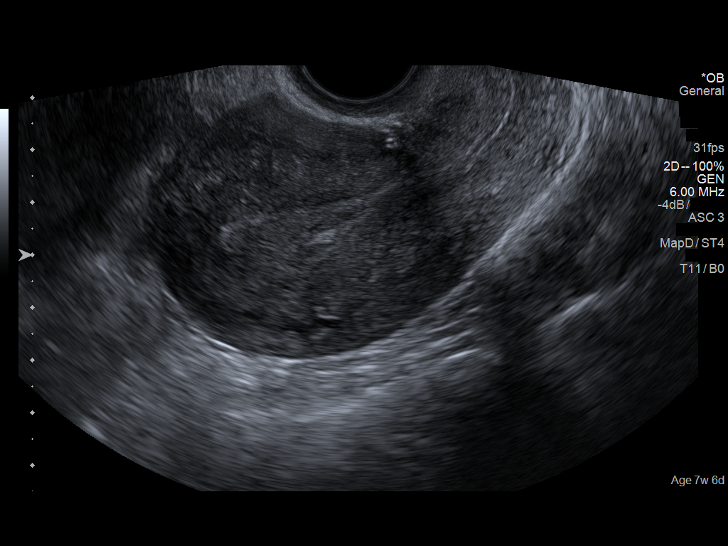
[im 51/106]
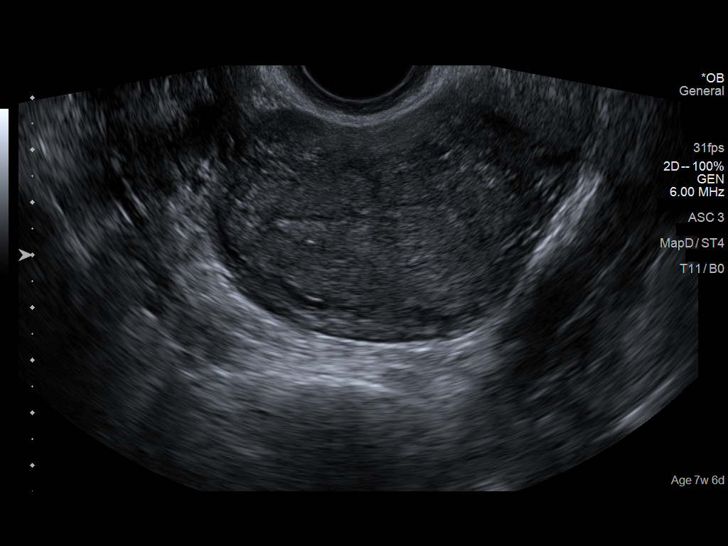
[im 59/106]
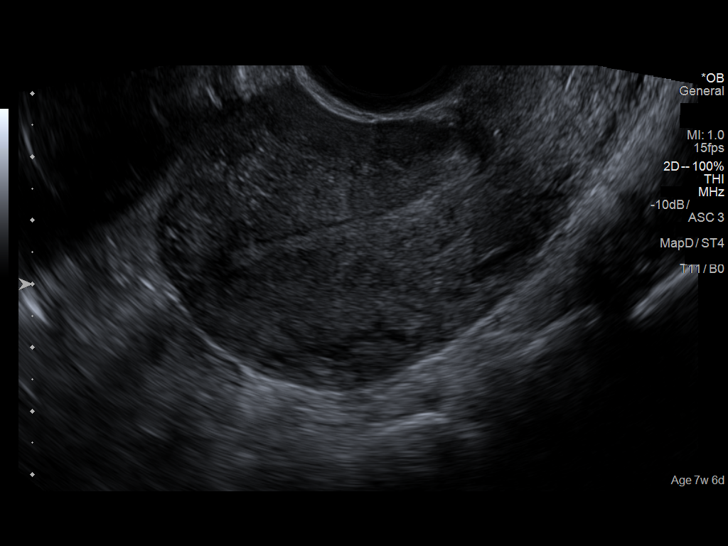
[im 67/106]
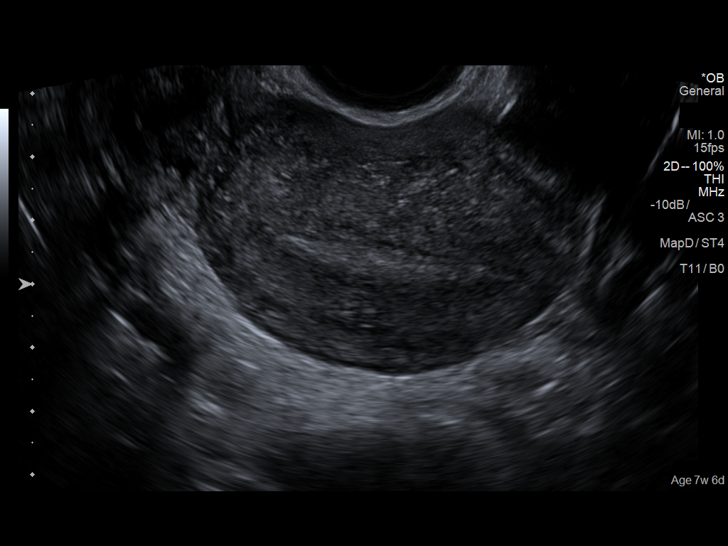
[im 74/106]
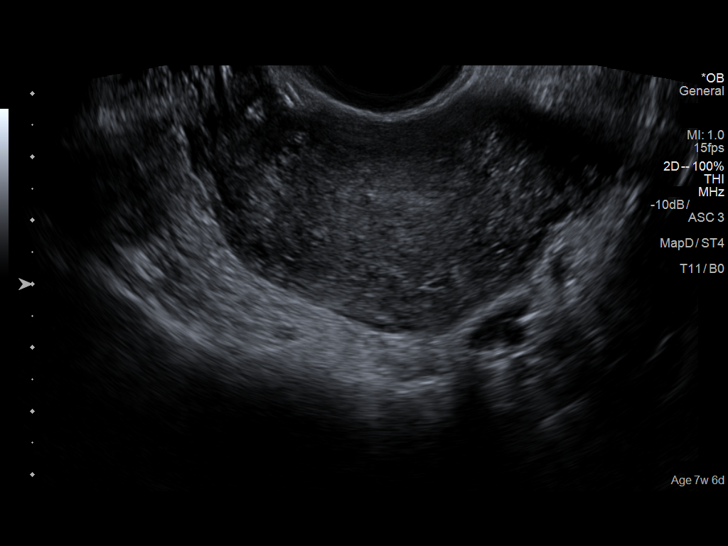
[im 82/106]
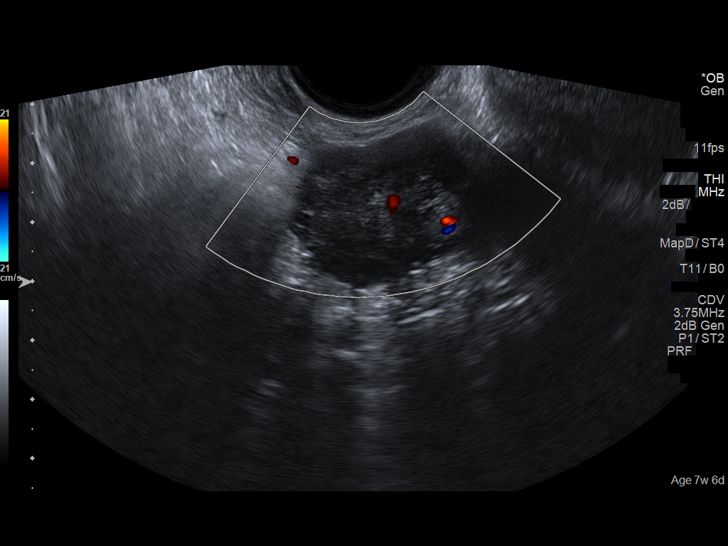
[im 90/106]
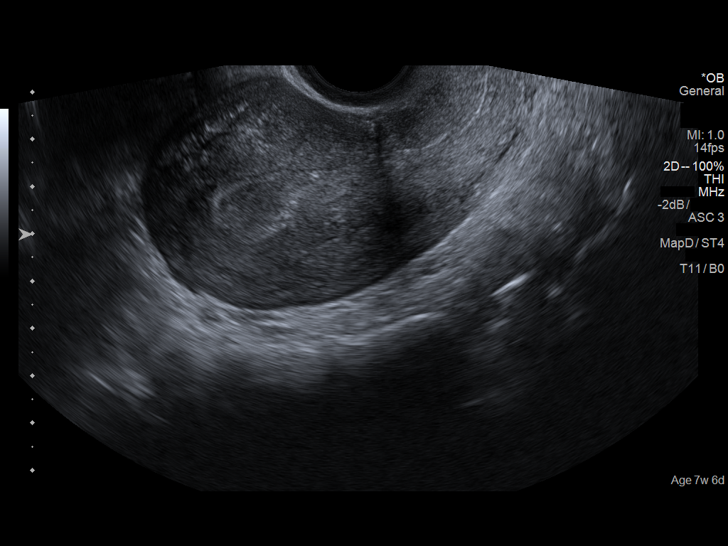
[im 98/106]
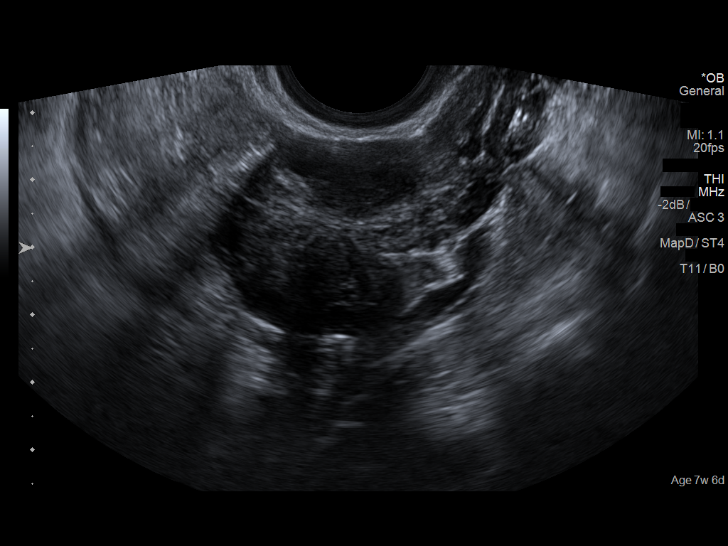
[im 106/106]
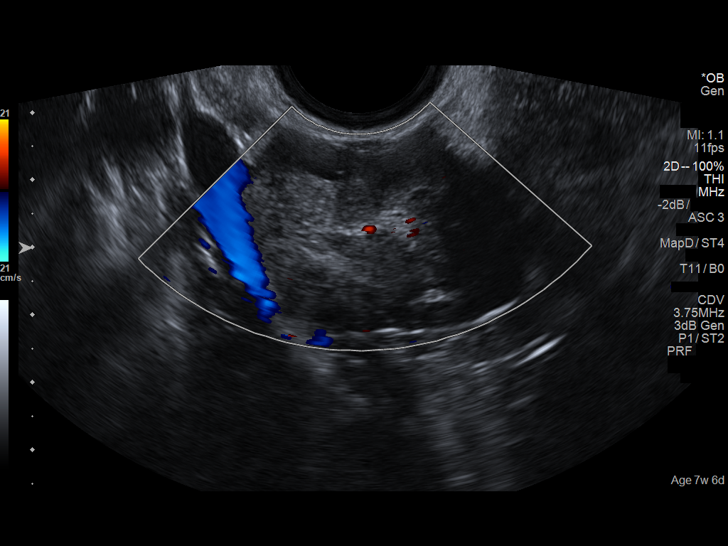

[14 of 28 positions shown; findings below may reference images not displayed]

FINDINGS: Intrauterine gestational sac: None

Yolk sac:  None

Embryo:  None

Subchorionic hemorrhage:  Does not apply

Maternal uterus/adnexae:

Right ovary: Normal

Left ovary: Normal

Other :The endometrium measures 12 mm in thickness. No focal
abnormality identified.

Free fluid:  Trace free fluid within the pelvis.
IMPRESSION: 1. No intrauterine gestational sac, yolk sac, or fetal pole
identified. Differential considerations include intrauterine
pregnancy too early to be sonographically visualized, missed
abortion, or ectopic pregnancy. Followup ultrasound is recommended
in 10-14 days for further evaluation.

## 2018-05-03 IMAGING — US US OB LIMITED
1 series · 10 of 10 positions shown · non-contrast
Comparison: none

CLINICAL DATA: Pelvic pain

EXAM:
LIMITED OBSTETRIC ULTRASOUND

[Series 1: us ob limited · 0.17mm/px · 10 acquisitions, 10 frames shown]
[im 1/10]
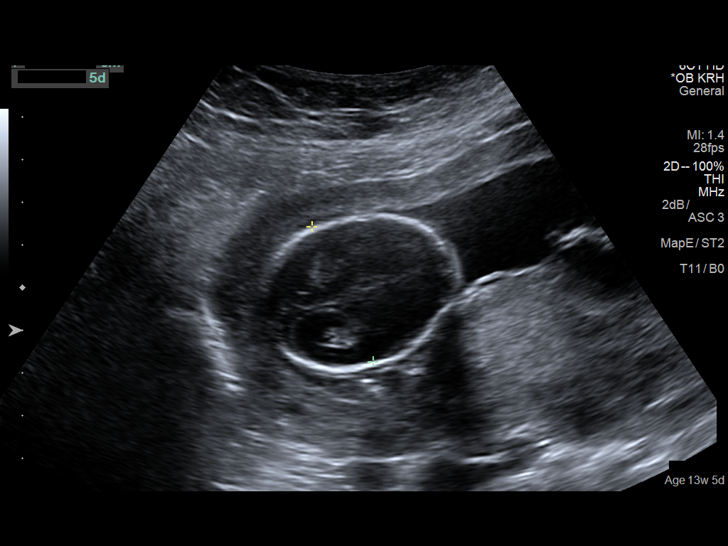
[im 2/10]
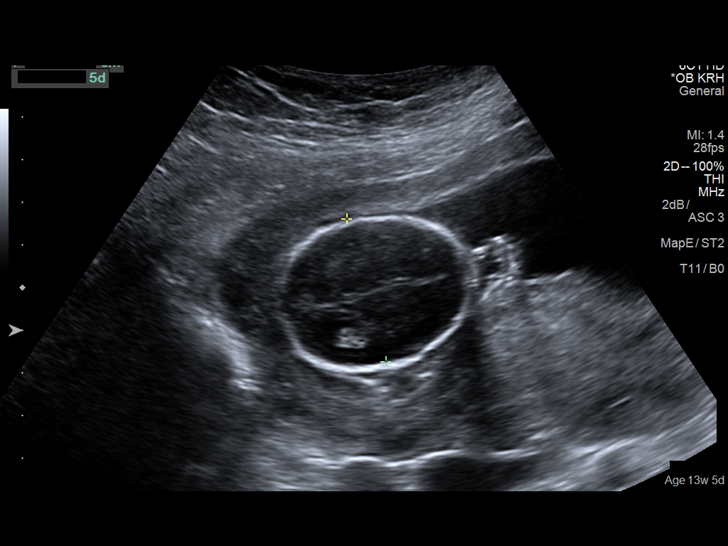
[im 3/10]
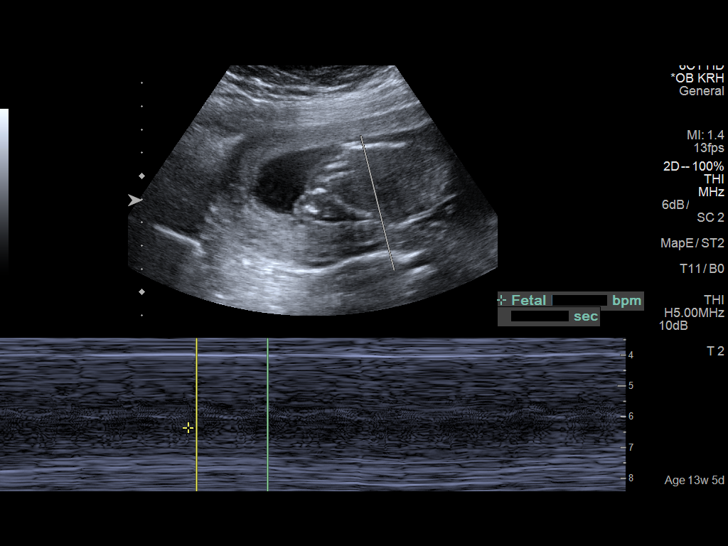
[im 4/10]
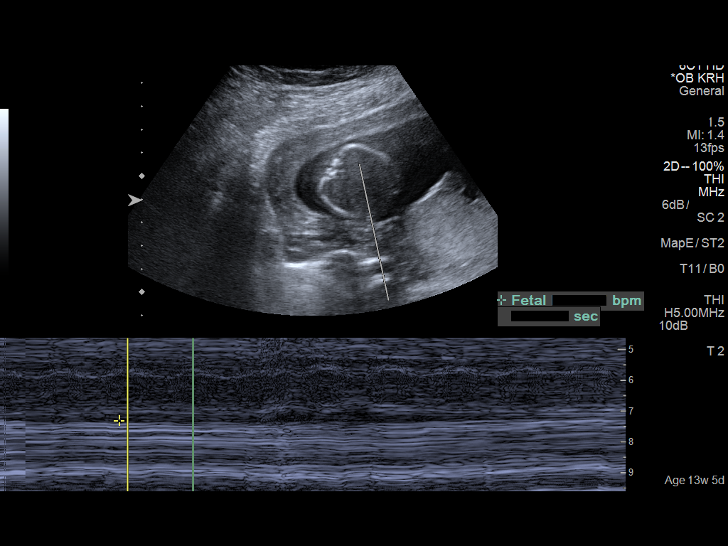
[im 5/10]
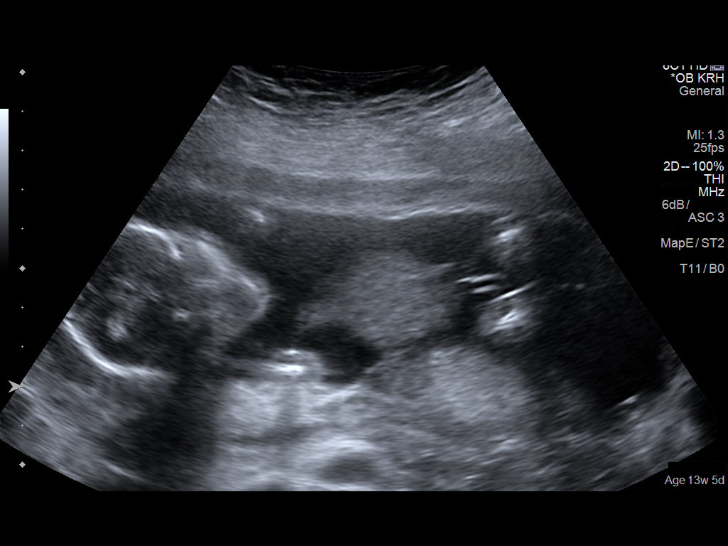
[im 6/10]
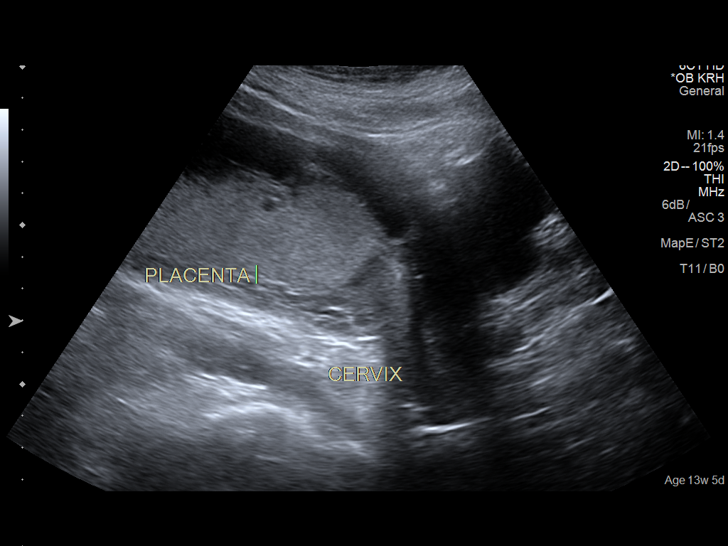
[im 7/10]
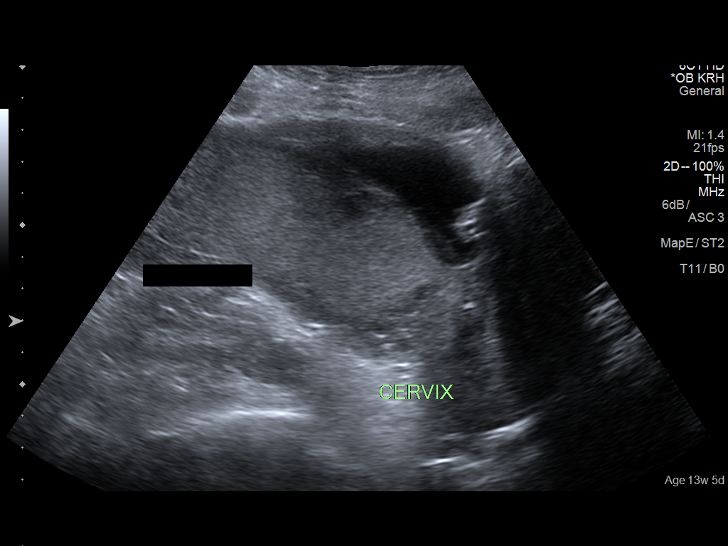
[im 8/10]
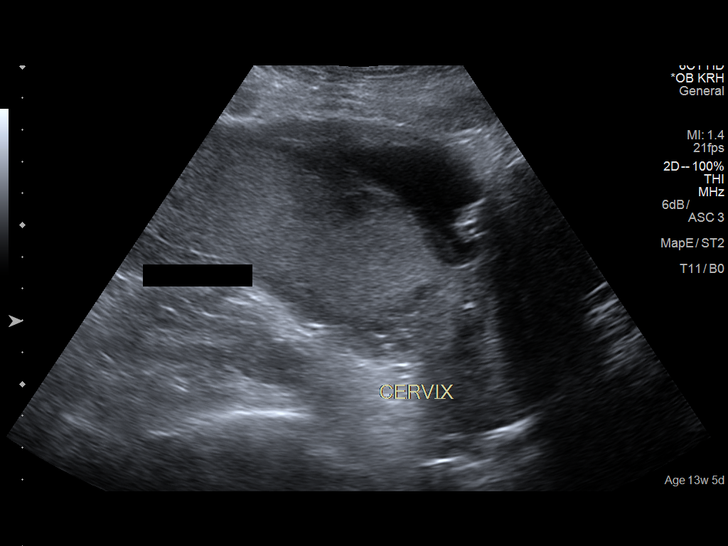
[im 9/10]
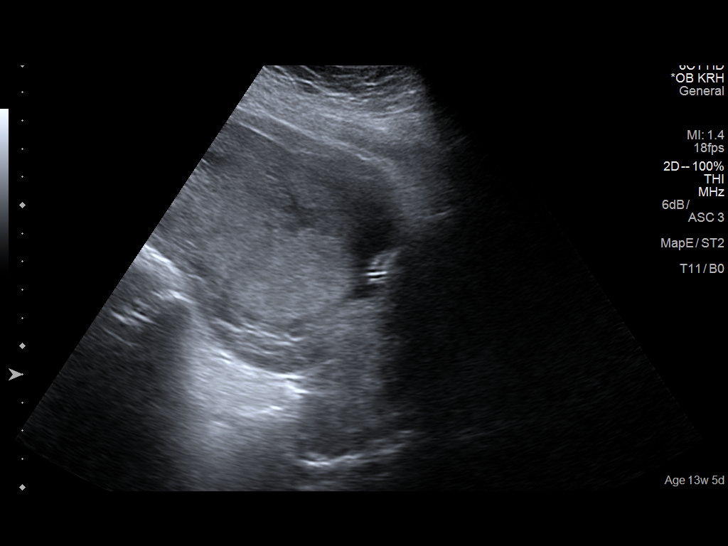
[im 10/10]
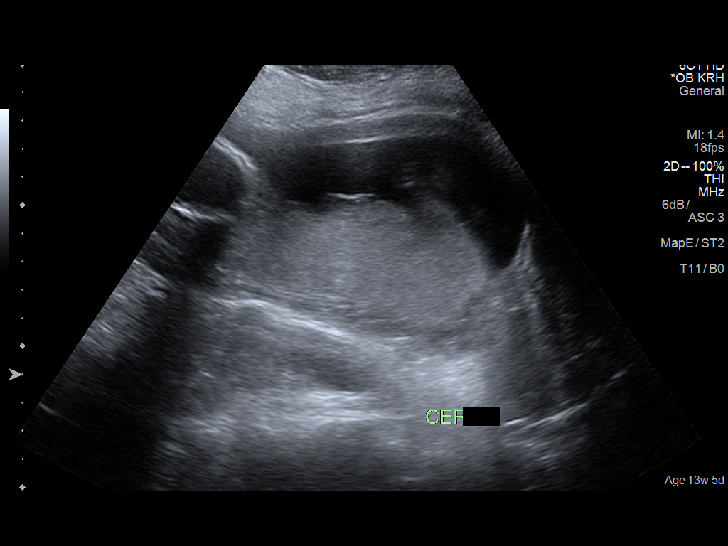

[10 of 10 positions shown; findings below may reference images not displayed]

FINDINGS: Number of Fetuses: 1

Heart Rate:  157 bpm

Movement: Present

Presentation: Breech

Placental Location: Posterior

Previa: Placental edge approaches the internal os.

Amniotic Fluid (Subjective):  Within normal limits.

BPD: 3.48 cm 16 w  5 d

MATERNAL FINDINGS:

Cervix:  Appears closed.

Uterus/Adnexae: Ovaries are non identified.  No free fluid
IMPRESSION: Single viable intrauterine pregnancy as above. Marginal placenta.
Attention on follow-up morphology scan is recommended.

This exam is performed on an emergent basis and does not
comprehensively evaluate fetal size, dating, or anatomy; follow-up
complete OB US should be considered if further fetal assessment is
warranted.

## 2019-03-21 ENCOUNTER — Emergency Department (HOSPITAL_BASED_OUTPATIENT_CLINIC_OR_DEPARTMENT_OTHER)
Admission: EM | Admit: 2019-03-21 | Discharge: 2019-03-21 | Disposition: A | Discharge status: Home / Self Care | Payer: Medicaid Other | Attending: Emergency Medicine | Admitting: Emergency Medicine

## 2019-03-21 ENCOUNTER — Other Ambulatory Visit: Payer: Self-pay

## 2019-03-21 ENCOUNTER — Emergency Department (HOSPITAL_BASED_OUTPATIENT_CLINIC_OR_DEPARTMENT_OTHER): Payer: Medicaid Other

## 2019-03-21 ENCOUNTER — Encounter (HOSPITAL_BASED_OUTPATIENT_CLINIC_OR_DEPARTMENT_OTHER): Payer: Self-pay | Admitting: *Deleted

## 2019-03-21 DIAGNOSIS — F17210 Nicotine dependence, cigarettes, uncomplicated: Secondary | ICD-10-CM | POA: Insufficient documentation

## 2019-03-21 DIAGNOSIS — O99332 Smoking (tobacco) complicating pregnancy, second trimester: Secondary | ICD-10-CM | POA: Diagnosis not present

## 2019-03-21 DIAGNOSIS — E876 Hypokalemia: Secondary | ICD-10-CM | POA: Insufficient documentation

## 2019-03-21 DIAGNOSIS — R1011 Right upper quadrant pain: Secondary | ICD-10-CM | POA: Diagnosis not present

## 2019-03-21 DIAGNOSIS — A599 Trichomoniasis, unspecified: Secondary | ICD-10-CM | POA: Diagnosis not present

## 2019-03-21 DIAGNOSIS — O26899 Other specified pregnancy related conditions, unspecified trimester: Secondary | ICD-10-CM | POA: Insufficient documentation

## 2019-03-21 DIAGNOSIS — N390 Urinary tract infection, site not specified: Secondary | ICD-10-CM | POA: Insufficient documentation

## 2019-03-21 DIAGNOSIS — Z3A17 17 weeks gestation of pregnancy: Secondary | ICD-10-CM | POA: Diagnosis not present

## 2019-03-21 HISTORY — DX: Essential (primary) hypertension: I10

## 2019-03-21 LAB — CBC WITH DIFFERENTIAL/PLATELET
Abs Immature Granulocytes: 0.05 10*3/uL (ref 0.00–0.07)
Basophils Absolute: 0 10*3/uL (ref 0.0–0.1)
Basophils Relative: 0 %
Eosinophils Absolute: 0 10*3/uL (ref 0.0–0.5)
Eosinophils Relative: 0 %
HCT: 33.1 % — ABNORMAL LOW (ref 36.0–46.0)
Hemoglobin: 10.7 g/dL — ABNORMAL LOW (ref 12.0–15.0)
Immature Granulocytes: 1 %
Lymphocytes Relative: 13 %
Lymphs Abs: 1.2 10*3/uL (ref 0.7–4.0)
MCH: 27.6 pg (ref 26.0–34.0)
MCHC: 32.3 g/dL (ref 30.0–36.0)
MCV: 85.5 fL (ref 80.0–100.0)
Monocytes Absolute: 0.6 10*3/uL (ref 0.1–1.0)
Monocytes Relative: 6 %
Neutro Abs: 7.6 10*3/uL (ref 1.7–7.7)
Neutrophils Relative %: 80 %
Platelets: 194 10*3/uL (ref 150–400)
RBC: 3.87 MIL/uL (ref 3.87–5.11)
RDW: 17.2 % — ABNORMAL HIGH (ref 11.5–15.5)
Smear Review: NORMAL
WBC: 9.5 10*3/uL (ref 4.0–10.5)
nRBC: 0 % (ref 0.0–0.2)

## 2019-03-21 LAB — COMPREHENSIVE METABOLIC PANEL
ALT: 12 U/L (ref 0–44)
AST: 15 U/L (ref 15–41)
Albumin: 3.5 g/dL (ref 3.5–5.0)
Alkaline Phosphatase: 43 U/L (ref 38–126)
Anion gap: 11 (ref 5–15)
BUN: 8 mg/dL (ref 6–20)
CO2: 22 mmol/L (ref 22–32)
Calcium: 8.7 mg/dL — ABNORMAL LOW (ref 8.9–10.3)
Chloride: 100 mmol/L (ref 98–111)
Creatinine, Ser: 0.52 mg/dL (ref 0.44–1.00)
GFR calc Af Amer: >60 mL/min (ref >60)
GFR calc non Af Amer: >60 mL/min (ref >60)
Glucose, Bld: 102 mg/dL — ABNORMAL HIGH (ref 70–99)
Potassium: 3.2 mmol/L — ABNORMAL LOW (ref 3.5–5.1)
Sodium: 133 mmol/L — ABNORMAL LOW (ref 135–145)
Total Bilirubin: 0.4 mg/dL (ref 0.3–1.2)
Total Protein: 7.1 g/dL (ref 6.5–8.1)

## 2019-03-21 LAB — URINALYSIS, MICROSCOPIC (REFLEX)

## 2019-03-21 LAB — URINALYSIS, ROUTINE W REFLEX MICROSCOPIC
Bilirubin Urine: NEGATIVE
Glucose, UA: NEGATIVE mg/dL
Hgb urine dipstick: NEGATIVE
Ketones, ur: >80 mg/dL — AB
Nitrite: NEGATIVE
Protein, ur: 30 mg/dL — AB
Specific Gravity, Urine: 1.025 (ref 1.005–1.030)
pH: 7 (ref 5.0–8.0)

## 2019-03-21 MED ORDER — ALUM & MAG HYDROXIDE-SIMETH 200-200-20 MG/5ML PO SUSP
15.0000 mL | Freq: Once | ORAL | Status: AC
  Administered 2019-03-21: 15 mL via ORAL
  Filled 2019-03-21: qty 30

## 2019-03-21 MED ORDER — CEPHALEXIN 500 MG PO CAPS: 500.0000 mg | ORAL_CAPSULE | Freq: Two times a day (BID) | ORAL | 0 refills | Status: AC

## 2019-03-21 MED ORDER — POTASSIUM CHLORIDE CRYS ER 20 MEQ PO TBCR
40.0000 meq | EXTENDED_RELEASE_TABLET | Freq: Once | ORAL | Status: AC
  Administered 2019-03-21: 40 meq via ORAL
  Filled 2019-03-21: qty 2

## 2019-03-21 MED ORDER — SODIUM CHLORIDE 0.9 % IV BOLUS
1000.0000 mL | Freq: Once | INTRAVENOUS | Status: AC
  Administered 2019-03-21: 16:00:00 1000 mL via INTRAVENOUS

## 2019-03-21 MED ORDER — METRONIDAZOLE 500 MG PO TABS
2000.0000 mg | ORAL_TABLET | Freq: Once | ORAL | Status: AC
  Administered 2019-03-21: 2000 mg via ORAL
  Filled 2019-03-21: qty 4

## 2019-03-21 MED ORDER — ONDANSETRON HCL 4 MG/2ML IJ SOLN
4.0000 mg | Freq: Once | INTRAMUSCULAR | Status: AC
  Administered 2019-03-21: 16:00:00 4 mg via INTRAVENOUS
  Filled 2019-03-21: qty 2

## 2019-03-21 MED ORDER — ACETAMINOPHEN 325 MG PO TABS
650.0000 mg | ORAL_TABLET | Freq: Once | ORAL | Status: AC
  Administered 2019-03-21: 16:00:00 650 mg via ORAL
  Filled 2019-03-21: qty 2

## 2019-03-21 MED ORDER — POTASSIUM CHLORIDE ER 10 MEQ PO TBCR: 20.0000 meq | EXTENDED_RELEASE_TABLET | Freq: Every day | ORAL | 0 refills | Status: AC

## 2019-03-21 NOTE — ED Notes (Signed)
Pt remains in US

## 2019-03-21 NOTE — Discharge Instructions (Signed)
Take the potassium as needed. Your urine found to have an infection and trichomonas.  You will need to inform your partners about the sexually transmitted disease. Take the antibiotics as prescribed for the entire course. Establish care with your OB/GYN. Return to the ED if you start to have worsening symptoms, vaginal bleeding, chest pain, shortness of breath.

## 2019-03-21 NOTE — ED Triage Notes (Addendum)
Upper abdominal pain and vomiting x 5 days. She states she is [redacted] weeks pregnant. She was told she might have gallstones by [facility]. States she has vomited x 4 today. She has not yet established prenatal care

## 2019-03-21 NOTE — ED Provider Notes (Signed)
[facility] EMERGENCY DEPARTMENT Provider Note   CSN: [id number] Arrival date & time: 03/21/19  1344     History   Chief Complaint Chief Complaint  Patient presents with   Abdominal Pain    18 wks preg    HPI Carrie Mcfarland is a 28 y.o. female with a past medical history of hypertension currently about [redacted] weeks pregnant presents to ED for vomiting and abdominal pain.  She states her symptoms have been going on for the past 5 days.  Reports cramping abdominal pain and several episodes of nonbloody, nonbilious emesis daily.  This is her fifth pregnancy and states that she did not have the similar symptoms in her prior 4 pregnancies.  She was seen at [facility] about 3 days ago but has not been taking the medications prescribed to her.  States that she is only taking Tylenol.  She endorses tobacco use but denies any other drug or alcohol use.  Denies any vaginal bleeding, vaginal discharge, dysuria, changes to bowel movements, fever, sick contacts with similar symptoms.  She was told in the past that she could have gallstones.     HPI  Past Medical History:  Diagnosis Date   Hypertension     There are no active problems to display for this patient.   Past Surgical History:  Procedure Laterality Date   CESAREAN SECTION       OB History    Gravida  2   Para      Term      Preterm      AB      Living        SAB      TAB      Ectopic      Multiple      Live Births               Home Medications    Prior to Admission medications   Medication Sig Start Date End Date Taking? Authorizing Provider  cephALEXin (KEFLEX) 500 MG capsule Take 1 capsule (500 mg total) by mouth 2 (two) times daily for 7 days. 03/21/19 03/28/19  Rally Ouch, Hillary BowHina, PA-C  HYDROcodone-acetaminophen (NORCO/VICODIN) 5-325 MG tablet Take 1 tablet by mouth every 6 (six) hours as needed. 11/30/16   Joy, Shawn C, PA-C  ibuprofen (ADVIL,MOTRIN) 600 MG tablet Take 1 tablet (600 mg  total) by mouth every 6 (six) hours as needed. 11/30/16   Joy, Shawn C, PA-C  lidocaine (XYLOCAINE) 2 % solution Use as directed 15 mLs in the mouth or throat as needed for mouth pain. 11/30/16   Joy, Shawn C, PA-C  metroNIDAZOLE (FLAGYL) 500 MG tablet Take 1 tablet (500 mg total) by mouth 2 (two) times daily. 10/23/17   Maxwell CaulLayden, Lindsey A, PA-C  ondansetron (ZOFRAN ODT) 4 MG disintegrating tablet Take 1 tablet (4 mg total) every 8 (eight) hours as needed by mouth for nausea or vomiting. 06/07/17   Mathews RobinsonsMitchell, Jessica B, PA-C  ondansetron (ZOFRAN) 4 MG tablet Take 1 tablet (4 mg total) by mouth every 6 (six) hours. 10/23/17   Graciella FreerLayden, Lindsey A, PA-C  potassium chloride (K-DUR) 10 MEQ tablet Take 2 tablets (20 mEq total) by mouth daily for 3 days. 03/21/19 03/24/19  Zhavia Cunanan, PA-C  potassium chloride SA (K-DUR,KLOR-CON) 20 MEQ tablet Take 1 tablet (20 mEq total) daily for 4 doses by mouth. 06/07/17 06/11/17  Georgiana ShoreMitchell, Jessica B, PA-C    Family History No family history on file.  Social History  Social History   Tobacco Use   Smoking status: Current Every Day Smoker    Packs/day: 0.50    Types: Cigarettes   Smokeless tobacco: Never Used  Substance Use Topics   Alcohol use: No   Drug use: No     Allergies   Coconut oil   Review of Systems Review of Systems  Constitutional: Negative for appetite change, chills and fever.  HENT: Negative for ear pain, rhinorrhea, sneezing and sore throat.   Eyes: Negative for photophobia and visual disturbance.  Respiratory: Negative for cough, chest tightness, shortness of breath and wheezing.   Cardiovascular: Negative for chest pain and palpitations.  Gastrointestinal: Positive for abdominal pain, nausea and vomiting. Negative for blood in stool, constipation and diarrhea.  Genitourinary: Negative for dysuria, hematuria and urgency.  Musculoskeletal: Negative for myalgias.  Skin: Negative for rash.  Neurological: Negative for dizziness, weakness and  light-headedness.     Physical Exam Updated Vital Signs BP (!) 163/81 (BP Location: Right Arm)    Pulse 61    Temp 99.1 F (37.3 C) (Oral)    Resp 18    Wt 76.3 kg    LMP  (LMP Unknown)    SpO2 100%    Breastfeeding No    BMI 33.97 kg/m   Physical Exam Vitals signs and nursing note reviewed.  Constitutional:      General: She is not in acute distress.    Appearance: She is well-developed.  HENT:     Head: Normocephalic and atraumatic.     Nose: Nose normal.  Eyes:     General: No scleral icterus.       Left eye: No discharge.     Conjunctiva/sclera: Conjunctivae normal.  Neck:     Musculoskeletal: Normal range of motion and neck supple.  Cardiovascular:     Rate and Rhythm: Normal rate and regular rhythm.     Heart sounds: Normal heart sounds. No murmur. No friction rub. No gallop.   Pulmonary:     Effort: Pulmonary effort is normal. No respiratory distress.     Breath sounds: Normal breath sounds.  Abdominal:     General: Bowel sounds are normal. There is no distension.     Palpations: Abdomen is soft.     Tenderness: There is generalized abdominal tenderness. There is no guarding.  Musculoskeletal: Normal range of motion.  Skin:    General: Skin is warm and dry.     Findings: No rash.  Neurological:     Mental Status: She is alert.     Motor: No abnormal muscle tone.     Coordination: Coordination normal.      ED Treatments / Results  Labs (all labs ordered are listed, but only abnormal results are displayed) Labs Reviewed  COMPREHENSIVE METABOLIC PANEL - Abnormal; Notable for the following components:      Result Value   Sodium 133 (*)    Potassium 3.2 (*)    Glucose, Bld 102 (*)    Calcium 8.7 (*)    All other components within normal limits  CBC WITH DIFFERENTIAL/PLATELET - Abnormal; Notable for the following components:   Hemoglobin 10.7 (*)    HCT 33.1 (*)    RDW 17.2 (*)    All other components within normal limits  URINALYSIS, ROUTINE W REFLEX  MICROSCOPIC - Abnormal; Notable for the following components:   APPearance CLOUDY (*)    Ketones, ur >80 (*)    Protein, ur 30 (*)    Leukocytes,Ua TRACE (*)  All other components within normal limits  URINALYSIS, MICROSCOPIC (REFLEX) - Abnormal; Notable for the following components:   Bacteria, UA RARE (*)    Trichomonas, UA PRESENT (*)    All other components within normal limits  URINE CULTURE    EKG None  Radiology [place] Ob Limited > 14 Wks  Result Date: 03/21/2019 CLINICAL DATA:  Epigastric pain.  No prenatal care. EXAM: LIMITED OBSTETRIC ULTRASOUND FINDINGS: Number of Fetuses: 1 Heart Rate:  160 bpm Movement: Visualized Presentation: Breech Placental Location: Fundal, posterior Previa: Absent Amniotic Fluid (Subjective):  Within normal limits. AFI:  cm FL: 2.5 cm 17 w  4 d MATERNAL FINDINGS: Cervix:  Appears closed. Uterus/Adnexae: No abnormality visualized. IMPRESSION: Seventeen week 4 day intrauterine pregnancy. Fetal heart rate 160 beats per minute. No acute maternal findings. This exam is performed on an emergent basis and does not comprehensively evaluate fetal size, dating, or anatomy; follow-up complete OB US should be considered if further fetal assessment is warranted. Electronically Signed   By: Charlett NoseKevin  Dover M.D.   On: 03/21/2019 15:36   [place] Abdomen Limited Ruq  Result Date: 03/21/2019 CLINICAL DATA:  Epigastric pain with nausea. EXAM: ULTRASOUND ABDOMEN LIMITED RIGHT UPPER QUADRANT COMPARISON:  02/09/2019.  02/06/2019.  01/01/2019. FINDINGS: Gallbladder: No gallstones or wall thickening visualized. No sonographic Murphy sign noted by sonographer. Common bile duct: Diameter: 3 mm Liver: Focal hypoechoic lesion in the subcapsular liver adjacent to the gallbladder measures 3.4 x 2.3 x 2.5 cm in the right hepatic lobe. This same lesion was measured at 2.8 x 1.8 x 2.5 cm on 01/01/2019. Portal vein is patent on color Doppler imaging with normal direction of blood flow towards the  liver. Other: None. IMPRESSION: 1. No substantial change since multiple prior abdominal ultrasound exams dating back to 01/01/2019. 3.4 cm hypoechoic subcapsular lesion in the right liver adjacent to the gallbladder fossa measures slightly larger on today's study. Potentially focal fatty deposition, MRI could be used to further evaluate as clinically warranted. Electronically Signed   By: Kennith CenterEric  Mansell M.D.   On: 03/21/2019 15:36    Procedures Procedures (including critical care time)  Medications Ordered in ED Medications  potassium chloride SA (K-DUR) CR tablet 40 mEq (has no administration in time range)  metroNIDAZOLE (FLAGYL) tablet 2,000 mg (has no administration in time range)  sodium chloride 0.9 % bolus 1,000 mL (0 mLs Intravenous Stopped 03/21/19 1639)  ondansetron (ZOFRAN) injection 4 mg (4 mg Intravenous Given 03/21/19 1531)  acetaminophen (TYLENOL) tablet 650 mg (650 mg Oral Given 03/21/19 1552)  alum & mag hydroxide-simeth (MAALOX/MYLANTA) 200-200-20 MG/5ML suspension 15 mL (15 mLs Oral Given 03/21/19 1602)     Initial Impression / Assessment and Plan / ED Course  I have reviewed the triage vital signs and the nursing notes.  Pertinent labs & imaging results that were available during my care of the patient were reviewed by me and considered in my medical decision making (see chart for details).        Carrie Mcfarland was evaluated in Emergency Department on 03/21/19  for the symptoms described in the history of present illness. He/she was evaluated in the context of the global COVID-19 pandemic, which necessitated consideration that the patient might be at risk for infection with the SARS-CoV-2 virus that causes COVID-19. Institutional protocols and algorithms that pertain to the evaluation of patients at risk for COVID-19 are in a state of rapid change based on information released by regulatory bodies including the CDC and federal and  state organizations. These policies and  algorithms were followed during the patient's care in the ED.  28 year old female who is approximately [redacted] weeks pregnant presents to ED continued abdominal pain and vomiting.  Seen and evaluated 3 days ago and given Zofran and Phenergan which she has not been taking.  Reports history of similar symptoms in the past but now with her prior 4 pregnancies.  On my exam there is tenderness palpation of the right upper quadrant without rebound or guarding.  She denies any vaginal or urinary complaints.  I am not able to view the ultrasound results at her prior visit and she has not yet established care with OB/GYN.  Pelvic ultrasound shows single IUP at 17 weeks and 4 days with fetal heart rate of 160 bpm.  Right upper quadrant ultrasound shows redemonstrated area near the gallbladder but LFTs are within normal limits today.  Lab work significant for hypokalemia of 3.2 which is repleted oral, urinalysis with trace leukocytes, bacteria and trichomonas.  Patient was given fluids, antiemetics here with improvement in her symptoms.  She is able to tolerate p.o. intake without difficulty.  Patient given p.o. Flagyl here and will be discharged home with Keflex for bacteriuria and remainder of potassium course.  Encouraged her to follow-up and establish care with her OB/GYN, take antiemetics as prescribed to her previously and to return for worsening symptoms.  Of note, UDS positive for THC at her visit earlier this week but patient denies. This could be the cause of her emesis as well.  Patient is hemodynamically stable, in NAD, and able to ambulate in the ED. Evaluation does not show pathology that would require ongoing emergent intervention or inpatient treatment. I explained the diagnosis to the patient. Pain has been managed and has no complaints prior to discharge. Patient is comfortable with above plan and is stable for discharge at this time. All questions were answered prior to disposition. Strict return precautions  for returning to the ED were discussed. Encouraged follow up with PCP.   An After Visit Summary was printed and given to the patient.   Portions of this note were generated with Scientist, clinical (histocompatibility and immunogenetics)Dragon dictation software. Dictation errors may occur despite best attempts at proofreading.  Final Clinical Impressions(s) / ED Diagnoses   Final diagnoses:  RUQ abdominal pain  [redacted] weeks gestation of pregnancy  Trichomonas infection  Hypokalemia  Lower urinary tract infectious disease    ED Discharge Orders         Ordered    potassium chloride (K-DUR) 10 MEQ tablet  Daily     03/21/19 1754    cephALEXin (KEFLEX) 500 MG capsule  2 times daily     03/21/19 1754           Dietrich PatesKhatri, Marlinda Miranda, PA-C 03/21/19 1759    Terrilee FilesButler, Michael C, MD 03/22/19 1053

## 2019-03-23 LAB — URINE CULTURE

## 2019-12-21 IMAGING — US LIMITED OBSTETRIC ULTRASOUND
1 series · 14 of 22 positions shown · non-contrast
Comparison: none

CLINICAL DATA: Epigastric pain.  No prenatal care.

EXAM:
LIMITED OBSTETRIC ULTRASOUND

[Series 1: limited obstetric ultrasound · 14 of 22 slices shown]
[im 1/22]
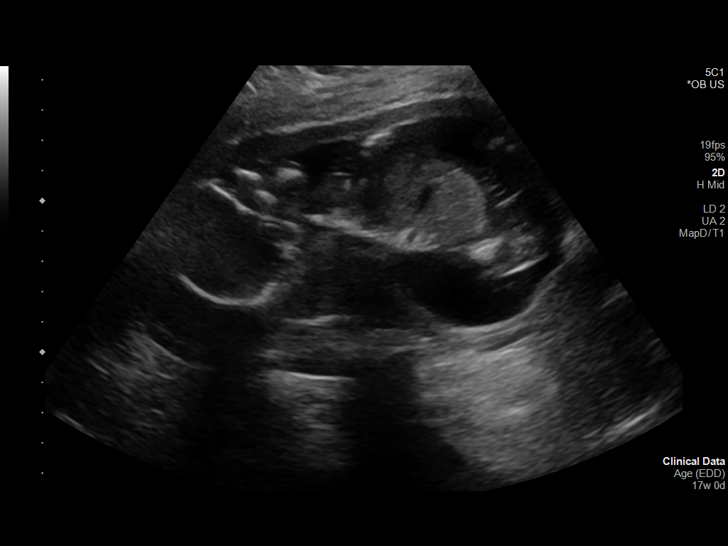
[im 3/22]
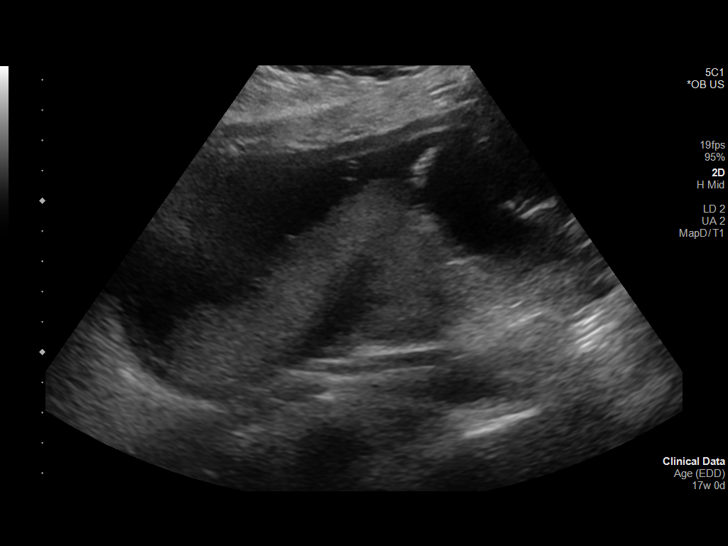
[im 4/22]
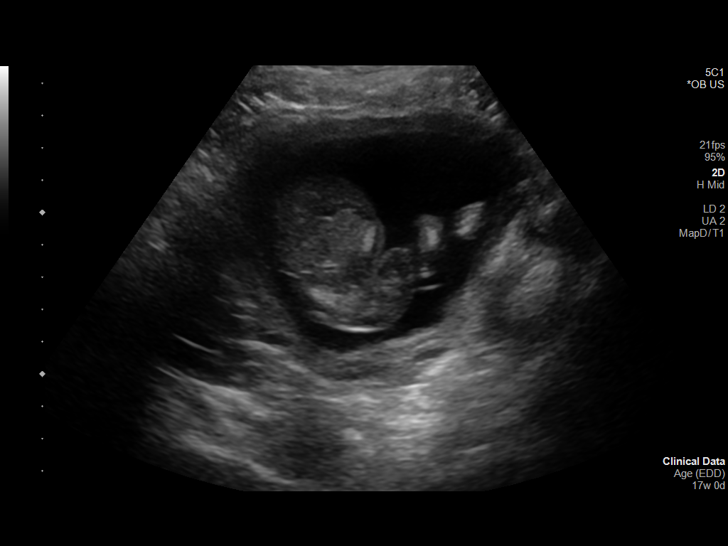
[im 6/22]
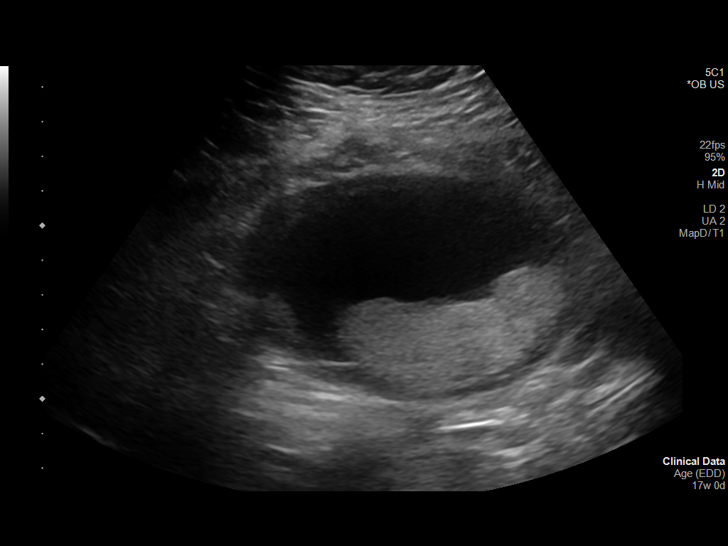
[im 8/22]
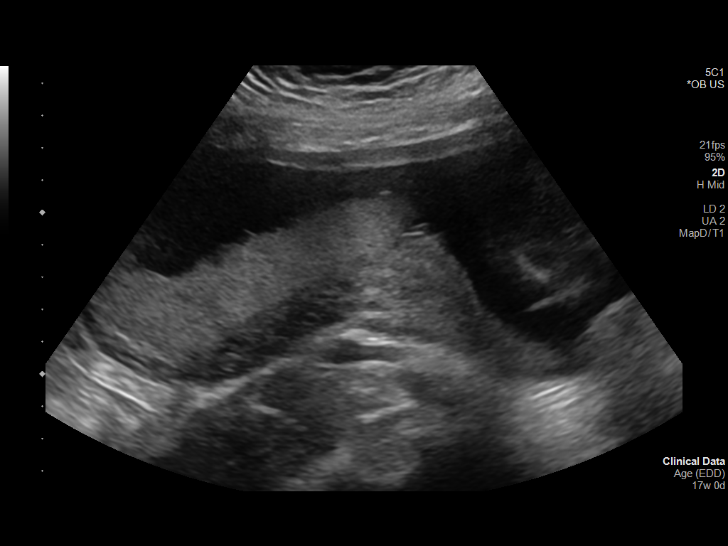
[im 9/22]
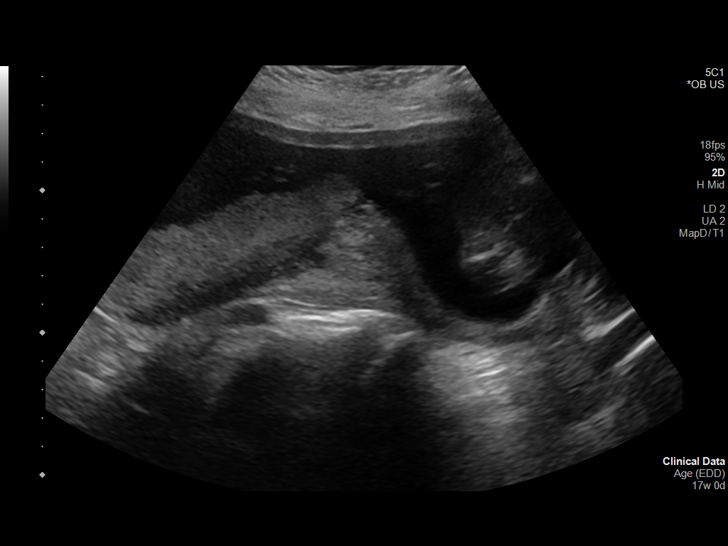
[im 11/22]
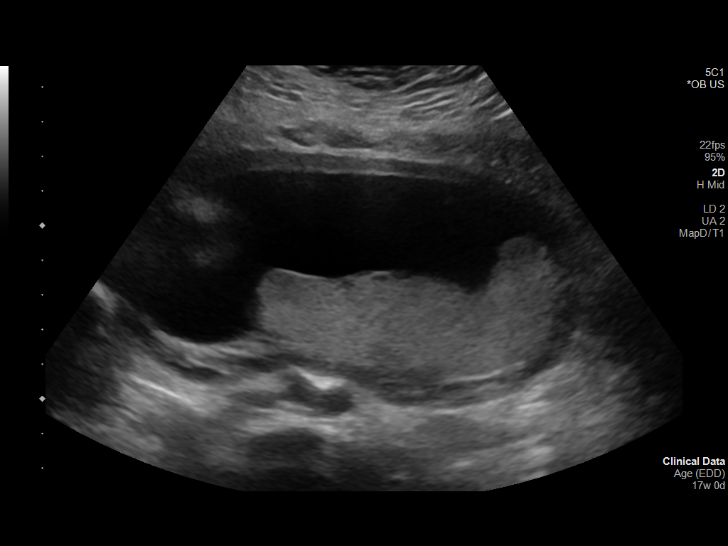
[im 12/22]
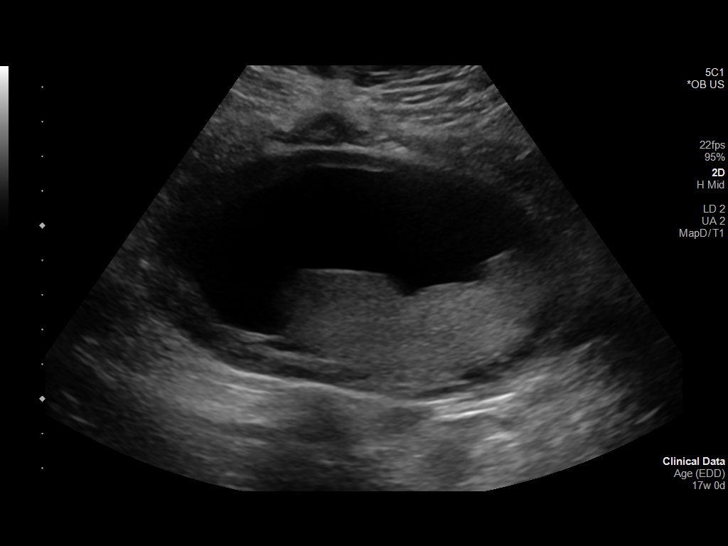
[im 14/22]
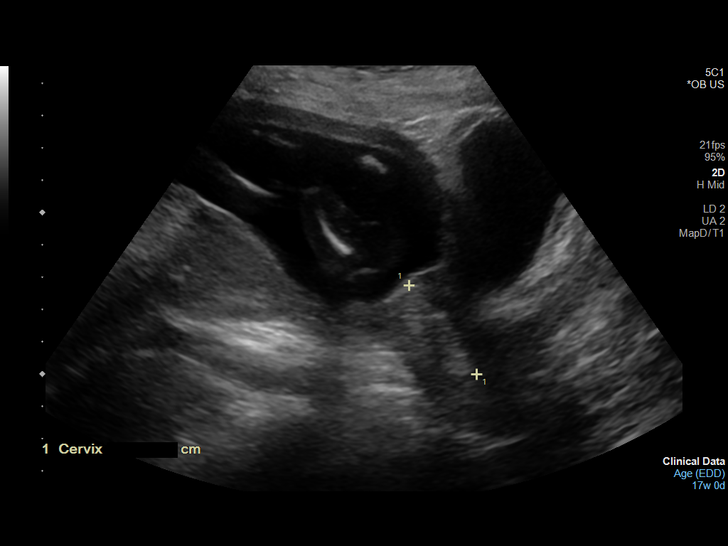
[im 15/22]
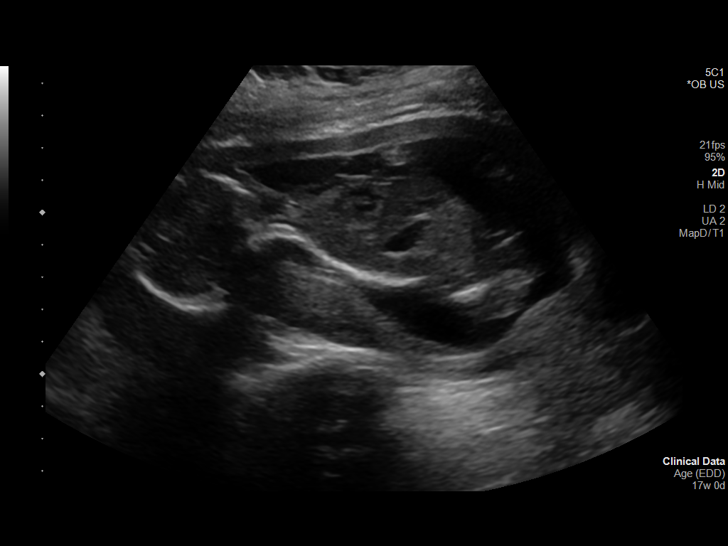
[im 17/22]
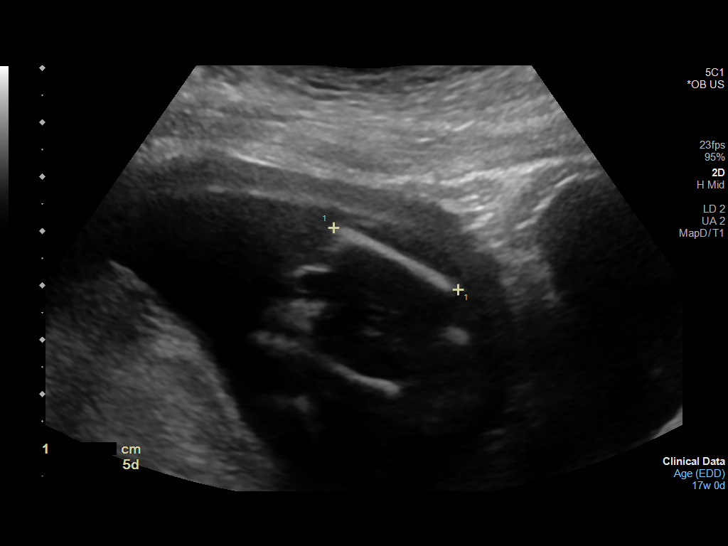
[im 19/22]
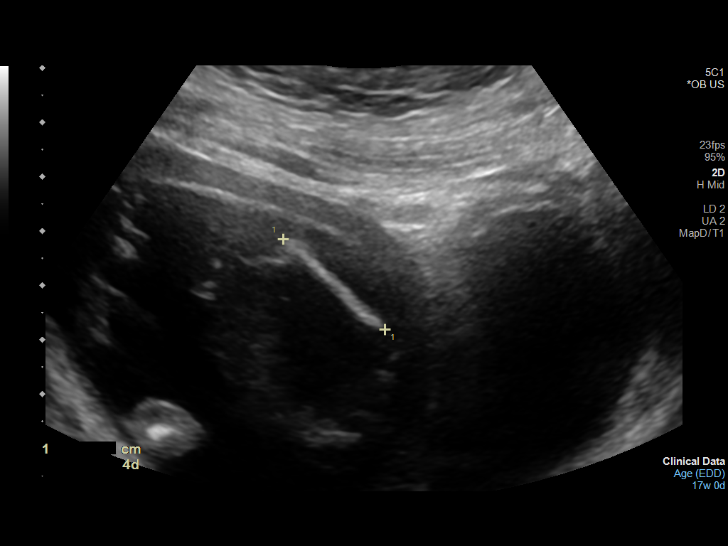
[im 20/22]
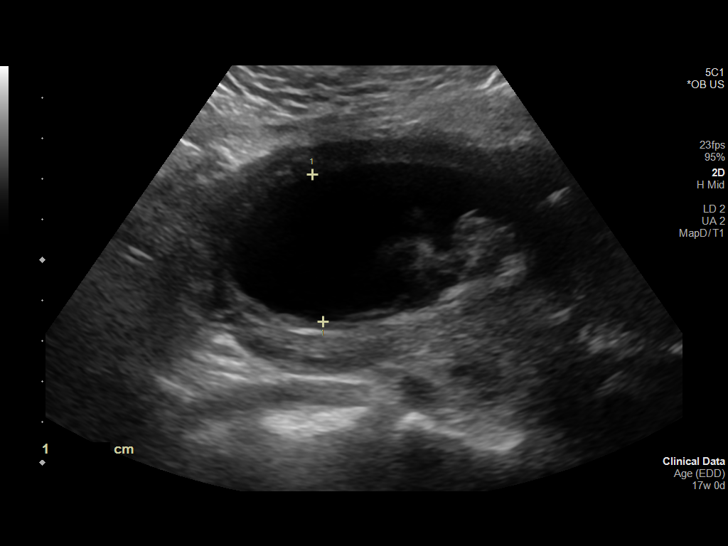
[im 22/22]
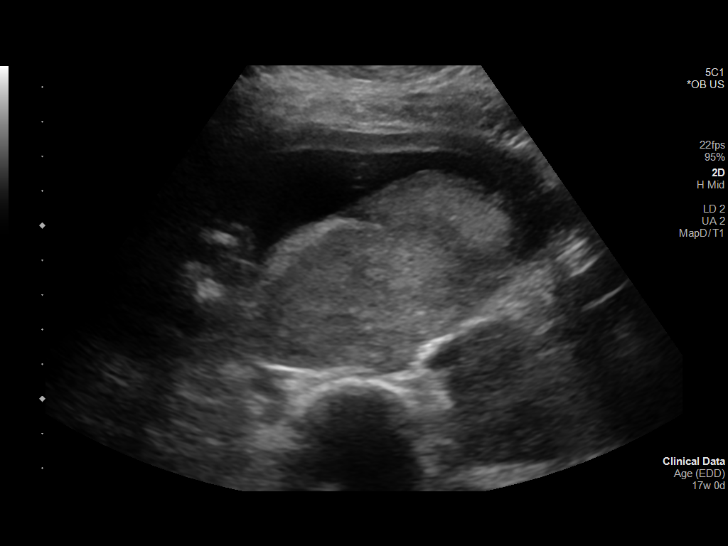

[14 of 22 positions shown; findings below may reference images not displayed]

FINDINGS: Number of Fetuses: 1

Heart Rate:  160 bpm

Movement: Visualized

Presentation: Breech

Placental Location: Fundal, posterior

Previa: Absent

Amniotic Fluid (Subjective):  Within normal limits.

AFI:  cm

FL: 2.5 cm 17 w  4 d

MATERNAL FINDINGS:

Cervix:  Appears closed.

Uterus/Adnexae: No abnormality visualized.
IMPRESSION: Seventeen week 4 day intrauterine pregnancy. Fetal heart rate 160
beats per minute. No acute maternal findings.

This exam is performed on an emergent basis and does not
comprehensively evaluate fetal size, dating, or anatomy; follow-up
complete OB US should be considered if further fetal assessment is
warranted.

## 2023-11-07 ENCOUNTER — Encounter (HOSPITAL_BASED_OUTPATIENT_CLINIC_OR_DEPARTMENT_OTHER): Payer: Self-pay | Admitting: Emergency Medicine

## 2023-11-07 ENCOUNTER — Other Ambulatory Visit: Payer: Self-pay

## 2023-11-07 ENCOUNTER — Emergency Department (HOSPITAL_BASED_OUTPATIENT_CLINIC_OR_DEPARTMENT_OTHER): Payer: Self-pay

## 2023-11-07 ENCOUNTER — Emergency Department (HOSPITAL_BASED_OUTPATIENT_CLINIC_OR_DEPARTMENT_OTHER)
Admission: EM | Admit: 2023-11-07 | Discharge: 2023-11-07 | Disposition: A | Discharge status: Home / Self Care | Payer: Self-pay | Attending: Emergency Medicine | Admitting: Emergency Medicine

## 2023-11-07 DIAGNOSIS — Y92410 Unspecified street and highway as the place of occurrence of the external cause: Secondary | ICD-10-CM | POA: Diagnosis not present

## 2023-11-07 DIAGNOSIS — R519 Headache, unspecified: Secondary | ICD-10-CM | POA: Insufficient documentation

## 2023-11-07 DIAGNOSIS — M542 Cervicalgia: Secondary | ICD-10-CM | POA: Insufficient documentation

## 2023-11-07 MED ORDER — CYCLOBENZAPRINE HCL 10 MG PO TABS
10.0000 mg | ORAL_TABLET | Freq: Two times a day (BID) | ORAL | 0 refills | Status: AC | PRN
Start: 2023-11-07 — End: ?

## 2023-11-07 NOTE — ED Notes (Signed)
 Patient transported to CT
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
